# Patient Record
Sex: Female | Born: 1980 | Race: White | Hispanic: No | Marital: Married | Smoking: Never smoker
Health system: Southern US, Community
[De-identification: ages and names within clinical notes are randomized; demographics above are authoritative.]

## PROBLEM LIST (undated history)

## (undated) DIAGNOSIS — Z3400 Encounter for supervision of normal first pregnancy, unspecified trimester: Secondary | ICD-10-CM

## (undated) DIAGNOSIS — Z98891 History of uterine scar from previous surgery: Secondary | ICD-10-CM

## (undated) DIAGNOSIS — K219 Gastro-esophageal reflux disease without esophagitis: Secondary | ICD-10-CM

## (undated) DIAGNOSIS — O34219 Maternal care for unspecified type scar from previous cesarean delivery: Secondary | ICD-10-CM

## (undated) DIAGNOSIS — Z3483 Encounter for supervision of other normal pregnancy, third trimester: Secondary | ICD-10-CM

## (undated) DIAGNOSIS — G43909 Migraine, unspecified, not intractable, without status migrainosus: Secondary | ICD-10-CM

## (undated) DIAGNOSIS — F419 Anxiety disorder, unspecified: Secondary | ICD-10-CM

## (undated) HISTORY — PX: TONSILLECTOMY: SUR1361

---
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
## 2004-07-04 ENCOUNTER — Ambulatory Visit: Payer: Self-pay | Admitting: Family Medicine

## 2004-11-05 ENCOUNTER — Emergency Department (HOSPITAL_COMMUNITY): Admission: EM | Admit: 2004-11-05 | Discharge: 2004-11-05 | Payer: Self-pay | Admitting: Emergency Medicine

## 2005-04-11 ENCOUNTER — Ambulatory Visit: Payer: Self-pay | Admitting: Family Medicine

## 2005-05-01 ENCOUNTER — Ambulatory Visit: Payer: Self-pay | Admitting: Family Medicine

## 2005-11-24 ENCOUNTER — Ambulatory Visit: Payer: Self-pay | Admitting: Family Medicine

## 2005-12-28 ENCOUNTER — Other Ambulatory Visit: Admission: RE | Admit: 2005-12-28 | Discharge: 2005-12-28 | Payer: Self-pay | Admitting: Obstetrics and Gynecology

## 2006-05-29 ENCOUNTER — Ambulatory Visit: Payer: Self-pay | Admitting: Family Medicine

## 2006-06-06 ENCOUNTER — Ambulatory Visit: Payer: Self-pay | Admitting: Family Medicine

## 2006-08-07 ENCOUNTER — Ambulatory Visit: Payer: Self-pay | Admitting: Family Medicine

## 2006-08-14 ENCOUNTER — Encounter: Payer: Self-pay | Admitting: Family Medicine

## 2006-08-14 DIAGNOSIS — N6019 Diffuse cystic mastopathy of unspecified breast: Secondary | ICD-10-CM | POA: Insufficient documentation

## 2006-08-14 DIAGNOSIS — F411 Generalized anxiety disorder: Secondary | ICD-10-CM | POA: Insufficient documentation

## 2006-08-14 DIAGNOSIS — F430 Acute stress reaction: Secondary | ICD-10-CM | POA: Insufficient documentation

## 2006-08-14 DIAGNOSIS — L708 Other acne: Secondary | ICD-10-CM

## 2006-08-28 ENCOUNTER — Ambulatory Visit: Payer: Self-pay | Admitting: Family Medicine

## 2006-11-13 ENCOUNTER — Ambulatory Visit: Payer: Self-pay | Admitting: Family Medicine

## 2006-11-15 ENCOUNTER — Telehealth (INDEPENDENT_AMBULATORY_CARE_PROVIDER_SITE_OTHER): Payer: Self-pay | Admitting: *Deleted

## 2007-09-05 ENCOUNTER — Telehealth: Payer: Self-pay | Admitting: Family Medicine

## 2007-10-03 ENCOUNTER — Ambulatory Visit: Payer: Self-pay | Admitting: Family Medicine

## 2007-10-03 DIAGNOSIS — H6690 Otitis media, unspecified, unspecified ear: Secondary | ICD-10-CM | POA: Insufficient documentation

## 2010-04-01 LAB — HEPATITIS B SURFACE ANTIGEN
Hepatitis B Surface Ag: NEGATIVE
Hepatitis B Surface Ag: NEGATIVE

## 2010-04-01 LAB — HIV ANTIBODY (ROUTINE TESTING W REFLEX): HIV: NONREACTIVE

## 2010-04-01 LAB — ABO/RH: RH Type: POSITIVE

## 2010-04-01 LAB — TYPE AND SCREEN
Antibody Screen: NEGATIVE
Antibody Screen: NEGATIVE

## 2010-04-17 NOTE — L&D Delivery Note (Signed)
Requested by Dr. Ellyn Hack to attend this C-section for FTP.  Born to a 29y/o Primigravida with [facility] and negative screens.  AROM 10 hours PTD with clear fluid.  Labor complicated by FTp thus C-section performed.  Loose cord around body and neck noted at delivery.  Infant handed to Neo crying.  Fried, bulb suctioned and kept warm.  APGAR 8 and 9. Care transfer to Dr. Hosie Poisson.  Perlie Gold, MD Neonatologist
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
## 2010-10-23 ENCOUNTER — Inpatient Hospital Stay (HOSPITAL_COMMUNITY): Admission: AD | Admit: 2010-10-23 | Payer: Self-pay | Source: Home / Self Care | Admitting: Obstetrics and Gynecology

## 2010-10-25 ENCOUNTER — Encounter (HOSPITAL_COMMUNITY): Payer: Self-pay

## 2010-10-25 ENCOUNTER — Inpatient Hospital Stay (HOSPITAL_COMMUNITY)
Admission: RE | Admit: 2010-10-25 | Discharge: 2010-10-30 | DRG: 766 | Disposition: A | Discharge status: Home / Self Care | Payer: 59 | Source: Ambulatory Visit | Attending: Obstetrics and Gynecology | Admitting: Obstetrics and Gynecology

## 2010-10-25 DIAGNOSIS — O480 Post-term pregnancy: Principal | ICD-10-CM | POA: Diagnosis present

## 2010-10-25 DIAGNOSIS — F430 Acute stress reaction: Secondary | ICD-10-CM

## 2010-10-25 DIAGNOSIS — H6690 Otitis media, unspecified, unspecified ear: Secondary | ICD-10-CM

## 2010-10-25 DIAGNOSIS — Z3400 Encounter for supervision of normal first pregnancy, unspecified trimester: Secondary | ICD-10-CM

## 2010-10-25 DIAGNOSIS — N6019 Diffuse cystic mastopathy of unspecified breast: Secondary | ICD-10-CM

## 2010-10-25 DIAGNOSIS — F411 Generalized anxiety disorder: Secondary | ICD-10-CM

## 2010-10-25 DIAGNOSIS — L708 Other acne: Secondary | ICD-10-CM

## 2010-10-25 HISTORY — DX: Migraine, unspecified, not intractable, without status migrainosus: G43.909

## 2010-10-25 HISTORY — DX: Encounter for supervision of normal first pregnancy, unspecified trimester: Z34.00

## 2010-10-25 LAB — RPR: RPR Ser Ql: NONREACTIVE

## 2010-10-25 LAB — TYPE AND SCREEN: Antibody Screen: NEGATIVE

## 2010-10-25 LAB — CBC
Hemoglobin: 11.8 g/dL — ABNORMAL LOW (ref 12.0–15.0)
MCHC: 32.8 g/dL (ref 30.0–36.0)
RDW: 13.9 % (ref 11.5–15.5)
WBC: 10.8 10*3/uL — ABNORMAL HIGH (ref 4.0–10.5)

## 2010-10-25 MED ORDER — NALBUPHINE SYRINGE 5 MG/0.5 ML
10.0000 mg | INJECTION | INTRAMUSCULAR | Status: DC | PRN
  Filled 2010-10-25: qty 1

## 2010-10-25 MED ORDER — OXYTOCIN 20 UNITS IN LACTATED RINGERS INFUSION - SIMPLE
125.0000 mL/h | Freq: Once | INTRAVENOUS | Status: AC
  Administered 2010-10-25: 4 m[IU]/min via INTRAVENOUS
  Filled 2010-10-25: qty 1000

## 2010-10-25 MED ORDER — PHENYLEPHRINE 40 MCG/ML (10ML) SYRINGE FOR IV PUSH (FOR BLOOD PRESSURE SUPPORT)
80.0000 ug | PREFILLED_SYRINGE | INTRAVENOUS | Status: DC | PRN
  Filled 2010-10-25: qty 5

## 2010-10-25 MED ORDER — EPHEDRINE 5 MG/ML INJ
10.0000 mg | INTRAVENOUS | Status: DC | PRN
  Filled 2010-10-25: qty 4

## 2010-10-25 MED ORDER — LACTATED RINGERS IV SOLN
INTRAVENOUS | Status: DC
  Administered 2010-10-25 – 2010-10-26 (×2): via INTRAVENOUS
  Administered 2010-10-26: 500 mL via INTRAVENOUS

## 2010-10-25 MED ORDER — IBUPROFEN 600 MG PO TABS
600.0000 mg | ORAL_TABLET | Freq: Four times a day (QID) | ORAL | Status: DC | PRN
  Administered 2010-10-26: 600 mg via ORAL
  Filled 2010-10-25: qty 1

## 2010-10-25 MED ORDER — LIDOCAINE HCL (PF) 1 % IJ SOLN: 30.0000 mL | Freq: Once | INTRAMUSCULAR | Status: AC | PRN

## 2010-10-25 MED ORDER — TERBUTALINE SULFATE 1 MG/ML IJ SOLN: 0.2500 mg | Freq: Once | INTRAMUSCULAR | Status: AC | PRN

## 2010-10-25 MED ORDER — CITRIC ACID-SODIUM CITRATE 334-500 MG/5ML PO SOLN
30.0000 mL | ORAL | Status: DC | PRN
  Administered 2010-10-26: 30 mL via ORAL
  Filled 2010-10-25: qty 15

## 2010-10-25 MED ORDER — ONDANSETRON HCL 4 MG/2ML IJ SOLN: 4.0000 mg | Freq: Four times a day (QID) | INTRAMUSCULAR | Status: DC | PRN

## 2010-10-25 MED ORDER — EPHEDRINE 5 MG/ML INJ
10.0000 mg | INTRAVENOUS | Status: DC | PRN
  Filled 2010-10-25 (×2): qty 4

## 2010-10-25 MED ORDER — FLEET ENEMA 7-19 GM/118ML RE ENEM: 1.0000 | ENEMA | RECTAL | Status: DC | PRN

## 2010-10-25 MED ORDER — ACETAMINOPHEN 325 MG PO TABS: 650.0000 mg | ORAL_TABLET | ORAL | Status: DC | PRN

## 2010-10-25 MED ORDER — PHENYLEPHRINE 40 MCG/ML (10ML) SYRINGE FOR IV PUSH (FOR BLOOD PRESSURE SUPPORT)
80.0000 ug | PREFILLED_SYRINGE | INTRAVENOUS | Status: DC | PRN
  Filled 2010-10-25 (×2): qty 5

## 2010-10-25 MED ORDER — DIPHENHYDRAMINE HCL 50 MG/ML IJ SOLN
12.5000 mg | INTRAMUSCULAR | Status: DC | PRN
  Administered 2010-10-26: 12.5 mg via INTRAVENOUS
  Filled 2010-10-25: qty 1

## 2010-10-25 MED ORDER — FENTANYL 2.5 MCG/ML BUPIVACAINE 1/10 % EPIDURAL INFUSION (WH - ANES)
2.0000 mL/h | INTRAMUSCULAR | Status: DC
  Administered 2010-10-26: 14 mL/h via EPIDURAL
  Filled 2010-10-25: qty 60

## 2010-10-25 MED ORDER — LACTATED RINGERS IV SOLN: 500.0000 mL | Freq: Once | INTRAVENOUS | Status: DC

## 2010-10-25 MED ORDER — LACTATED RINGERS IV SOLN: 500.0000 mL | INTRAVENOUS | Status: DC | PRN

## 2010-10-25 MED ORDER — OXYTOCIN 20 UNITS IN LACTATED RINGERS INFUSION - SIMPLE
1.0000 m[IU]/min | INTRAVENOUS | Status: DC
  Administered 2010-10-25: 2 m[IU]/min via INTRAVENOUS
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
## 2010-10-25 NOTE — Progress Notes (Signed)
Margaret Campbell is a 30 y.o. G1P0000 at [redacted]w[redacted]d by LMP admitted for induction of labor due to Post dates. Due date 10/23/10.  Subjective: Contractions uncomfortable, o/w doing well.  Anxious to get going!  Objective: BP 114/70  Pulse 97  Resp 18  Ht 5\' 5"  (1.651 m)  Wt 99.791 kg (220 lb)  BMI 36.61 kg/m2  LMP 01/16/2010     FHT:  FHR: 120-140 bpm, variability: moderate,  accelerations:  Present,  decelerations:  Present variables after AROM UC:   regular, every 2-6 minutes SVE:   Dilation: 4 Effacement (%): 50 Station: -2 Exam by:: [facility] AROM - copious clear fluid.   Pitocin at 20mU/min  Labs: Lab Results  Component Value Date   WBC 10.8* 10/25/2010   HGB 11.8* 10/25/2010   HCT 36.0 10/25/2010   MCV 86.1 10/25/2010   PLT 207 10/25/2010    Assessment / Plan: Induction of labor due to postterm,  progressing well on pitocin AROM performed.    Labor: Progressing normally Preeclampsia:  no signs or symptoms of toxicity Fetal Wellbeing:  Category II Pain Control:  Epidural prn vs IV pain meds I/D:  n/a Anticipated MOD:  NSVD  BOVARD,Alonso Gapinski 10/25/2010, 8:12 PM

## 2010-10-25 NOTE — Progress Notes (Signed)
Pt with variables, FHTs 140s, mod variability; toco q 2-4 min. IUPC placed w/o diff/ comp. T/C amniinfusion if variables continue  JBOVARD MD

## 2010-10-26 ENCOUNTER — Inpatient Hospital Stay (HOSPITAL_COMMUNITY): Payer: 59 | Admitting: Anesthesiology

## 2010-10-26 ENCOUNTER — Encounter (HOSPITAL_COMMUNITY)
Admission: RE | Disposition: A | Discharge status: Home / Self Care | Payer: Self-pay | Source: Ambulatory Visit | Attending: Obstetrics and Gynecology

## 2010-10-26 ENCOUNTER — Encounter (HOSPITAL_COMMUNITY): Payer: Self-pay | Admitting: *Deleted

## 2010-10-26 ENCOUNTER — Encounter (HOSPITAL_COMMUNITY): Payer: Self-pay | Admitting: Anesthesiology

## 2010-10-26 DIAGNOSIS — Z3400 Encounter for supervision of normal first pregnancy, unspecified trimester: Secondary | ICD-10-CM

## 2010-10-26 HISTORY — DX: Encounter for supervision of normal first pregnancy, unspecified trimester: Z34.00

## 2010-10-26 SURGERY — Surgical Case
Anesthesia: Regional

## 2010-10-26 MED ORDER — OXYCODONE-ACETAMINOPHEN 5-325 MG PO TABS
1.0000 | ORAL_TABLET | ORAL | Status: DC | PRN
  Administered 2010-10-27 – 2010-10-30 (×11): 1 via ORAL
  Filled 2010-10-26 (×11): qty 1

## 2010-10-26 MED ORDER — LIDOCAINE-EPINEPHRINE (PF) 2 %-1:200000 IJ SOLN
INTRAMUSCULAR | Status: DC | PRN
  Administered 2010-10-26 (×2): 5 mL via EPIDURAL

## 2010-10-26 MED ORDER — KETOROLAC TROMETHAMINE 60 MG/2ML IM SOLN
60.0000 mg | Freq: Once | INTRAMUSCULAR | Status: AC | PRN
  Administered 2010-10-26: 60 mg via INTRAMUSCULAR

## 2010-10-26 MED ORDER — NALBUPHINE HCL 10 MG/ML IJ SOLN
5.0000 mg | INTRAMUSCULAR | Status: AC | PRN
  Filled 2010-10-26: qty 1

## 2010-10-26 MED ORDER — ONDANSETRON HCL 4 MG/2ML IJ SOLN
INTRAMUSCULAR | Status: DC | PRN
  Administered 2010-10-26: 4 mg via INTRAMUSCULAR

## 2010-10-26 MED ORDER — OXYTOCIN 20 UNITS IN LACTATED RINGERS INFUSION - SIMPLE: 125.0000 mL/h | INTRAVENOUS | Status: AC

## 2010-10-26 MED ORDER — PHENYLEPHRINE HCL 10 MG/ML IJ SOLN
INTRAMUSCULAR | Status: DC | PRN
  Administered 2010-10-26: 80 ug via INTRAVENOUS
  Administered 2010-10-26: 40 ug via INTRAVENOUS
  Administered 2010-10-26: 80 ug via INTRAVENOUS

## 2010-10-26 MED ORDER — PRENATAL PLUS 27-1 MG PO TABS
1.0000 | ORAL_TABLET | Freq: Every day | ORAL | Status: DC
  Administered 2010-10-26 – 2010-10-30 (×5): 1 via ORAL
  Filled 2010-10-26 (×4): qty 1

## 2010-10-26 MED ORDER — KETOROLAC TROMETHAMINE 30 MG/ML IJ SOLN: 30.0000 mg | Freq: Four times a day (QID) | INTRAMUSCULAR | Status: AC | PRN

## 2010-10-26 MED ORDER — MORPHINE SULFATE 0.5 MG/ML IJ SOLN
INTRAMUSCULAR | Status: AC
  Filled 2010-10-26: qty 10

## 2010-10-26 MED ORDER — DIPHENHYDRAMINE HCL 25 MG PO CAPS
25.0000 mg | ORAL_CAPSULE | Freq: Four times a day (QID) | ORAL | Status: DC | PRN
  Administered 2010-10-26: 25 mg via ORAL
  Filled 2010-10-26: qty 1

## 2010-10-26 MED ORDER — IBUPROFEN 600 MG PO TABS
600.0000 mg | ORAL_TABLET | Freq: Four times a day (QID) | ORAL | Status: DC
  Filled 2010-10-26: qty 1

## 2010-10-26 MED ORDER — FENTANYL CITRATE 0.05 MG/ML IJ SOLN: 25.0000 ug | INTRAMUSCULAR | Status: DC | PRN

## 2010-10-26 MED ORDER — MORPHINE SULFATE 10 MG/ML IJ SOLN
INTRAMUSCULAR | Status: DC | PRN
  Administered 2010-10-26: 1 mg via INTRAVENOUS

## 2010-10-26 MED ORDER — HYDROMORPHONE HCL 1 MG/ML IJ SOLN
0.5000 mg | INTRAMUSCULAR | Status: DC | PRN
  Administered 2010-10-26 (×2): 0.5 mg via INTRAVENOUS

## 2010-10-26 MED ORDER — NATALCARE PIC 60-1 MG PO TABS: 1.0000 | ORAL_TABLET | Freq: Every day | ORAL | Status: DC

## 2010-10-26 MED ORDER — OXYTOCIN 20 UNITS IN LACTATED RINGERS INFUSION - SIMPLE
INTRAVENOUS | Status: DC | PRN
  Administered 2010-10-26: 20 [IU] via INTRAVENOUS

## 2010-10-26 MED ORDER — ZOLPIDEM TARTRATE 5 MG PO TABS: 5.0000 mg | ORAL_TABLET | Freq: Every evening | ORAL | Status: DC | PRN

## 2010-10-26 MED ORDER — LIDOCAINE-EPINEPHRINE (PF) 2 %-1:200000 IJ SOLN
INTRAMUSCULAR | Status: AC
  Filled 2010-10-26: qty 20

## 2010-10-26 MED ORDER — SODIUM BICARBONATE 8.4 % IV SOLN
INTRAVENOUS | Status: AC
  Filled 2010-10-26: qty 50

## 2010-10-26 MED ORDER — MEPERIDINE HCL 25 MG/ML IJ SOLN: 6.2500 mg | INTRAMUSCULAR | Status: DC | PRN

## 2010-10-26 MED ORDER — SENNOSIDES-DOCUSATE SODIUM 8.6-50 MG PO TABS
1.0000 | ORAL_TABLET | Freq: Every day | ORAL | Status: DC
  Administered 2010-10-26 – 2010-10-29 (×4): 2 via ORAL

## 2010-10-26 MED ORDER — MIDAZOLAM HCL 2 MG/2ML IJ SOLN
INTRAMUSCULAR | Status: AC
  Filled 2010-10-26: qty 4

## 2010-10-26 MED ORDER — WITCH HAZEL-GLYCERIN EX PADS: MEDICATED_PAD | CUTANEOUS | Status: DC | PRN

## 2010-10-26 MED ORDER — EPHEDRINE SULFATE 50 MG/ML IJ SOLN
INTRAMUSCULAR | Status: DC | PRN
  Administered 2010-10-26: 10 mg via INTRAVENOUS
  Administered 2010-10-26: 20 mg via INTRAVENOUS

## 2010-10-26 MED ORDER — OXYTOCIN 20 UNITS IN LACTATED RINGERS INFUSION - SIMPLE
1.0000 m[IU]/min | INTRAVENOUS | Status: DC
  Administered 2010-10-26: 41.667 m[IU]/min via INTRAVENOUS
  Filled 2010-10-26: qty 1000

## 2010-10-26 MED ORDER — SODIUM CHLORIDE 0.9 % IV SOLN
1.0000 ug/kg/h | INTRAVENOUS | Status: DC | PRN
  Filled 2010-10-26: qty 2.5

## 2010-10-26 MED ORDER — IBUPROFEN 800 MG PO TABS
800.0000 mg | ORAL_TABLET | Freq: Three times a day (TID) | ORAL | Status: DC
  Administered 2010-10-27 – 2010-10-30 (×9): 800 mg via ORAL
  Filled 2010-10-26 (×9): qty 1

## 2010-10-26 MED ORDER — LACTATED RINGERS IV SOLN
INTRAVENOUS | Status: DC | PRN
  Administered 2010-10-26 (×2): via INTRAVENOUS

## 2010-10-26 MED ORDER — LIDOCAINE HCL (PF) 2 % IJ SOLN
INTRAMUSCULAR | Status: DC | PRN
  Administered 2010-10-26 (×3): 40 mg

## 2010-10-26 MED ORDER — EPHEDRINE 5 MG/ML INJ
INTRAVENOUS | Status: AC
  Filled 2010-10-26: qty 10

## 2010-10-26 MED ORDER — CEFAZOLIN SODIUM-DEXTROSE 2-3 GM-% IV SOLR
2.0000 g | Freq: Once | INTRAVENOUS | Status: DC
  Filled 2010-10-26: qty 50

## 2010-10-26 MED ORDER — SODIUM CHLORIDE 0.9 % IJ SOLN: 3.0000 mL | INTRAMUSCULAR | Status: DC | PRN

## 2010-10-26 MED ORDER — MENTHOL 3 MG MT LOZG: 1.0000 | LOZENGE | OROMUCOSAL | Status: DC | PRN

## 2010-10-26 MED ORDER — IBUPROFEN 600 MG PO TABS
600.0000 mg | ORAL_TABLET | Freq: Four times a day (QID) | ORAL | Status: DC | PRN
  Administered 2010-10-26 – 2010-10-27 (×3): 600 mg via ORAL
  Filled 2010-10-26 (×2): qty 1

## 2010-10-26 MED ORDER — PRENATAL PLUS 27-1 MG PO TABS
1.0000 | ORAL_TABLET | Freq: Every day | ORAL | Status: DC
  Filled 2010-10-26: qty 1

## 2010-10-26 MED ORDER — NALOXONE HCL 0.4 MG/ML IJ SOLN: 1.0000 ug/kg/h | INTRAMUSCULAR | Status: DC | PRN

## 2010-10-26 MED ORDER — OXYTOCIN 10 UNIT/ML IJ SOLN: 20.0000 [IU] | INTRAVENOUS | Status: DC | PRN

## 2010-10-26 MED ORDER — NALOXONE HCL 0.4 MG/ML IJ SOLN: 0.4000 mg | INTRAMUSCULAR | Status: DC | PRN

## 2010-10-26 MED ORDER — MORPHINE SULFATE (PF) 0.5 MG/ML IJ SOLN
INTRAMUSCULAR | Status: DC | PRN
  Administered 2010-10-26: 4 mg via EPIDURAL

## 2010-10-26 MED ORDER — ONDANSETRON HCL 4 MG/2ML IJ SOLN: 4.0000 mg | INTRAMUSCULAR | Status: DC | PRN

## 2010-10-26 MED ORDER — KETOROLAC TROMETHAMINE 60 MG/2ML IM SOLN
60.0000 mg | Freq: Once | INTRAMUSCULAR | Status: AC | PRN
  Filled 2010-10-26: qty 2

## 2010-10-26 MED ORDER — CEFAZOLIN SODIUM-DEXTROSE 2-3 GM-% IV SOLR: 2.0000 g | Freq: Three times a day (TID) | INTRAVENOUS | Status: DC

## 2010-10-26 MED ORDER — ONDANSETRON HCL 4 MG PO TABS: 4.0000 mg | ORAL_TABLET | ORAL | Status: DC | PRN

## 2010-10-26 MED ORDER — CEFAZOLIN SODIUM 1-5 GM-% IV SOLN
INTRAVENOUS | Status: DC | PRN
  Administered 2010-10-26: 2 g via INTRAVENOUS

## 2010-10-26 MED ORDER — SIMETHICONE 80 MG PO CHEW: 80.0000 mg | CHEWABLE_TABLET | ORAL | Status: DC | PRN

## 2010-10-26 MED ORDER — ONDANSETRON HCL 4 MG/2ML IJ SOLN
INTRAMUSCULAR | Status: AC
  Filled 2010-10-26: qty 2

## 2010-10-26 MED ORDER — OXYTOCIN 10 UNIT/ML IJ SOLN
INTRAMUSCULAR | Status: AC
  Filled 2010-10-26: qty 2

## 2010-10-26 MED ORDER — TETANUS-DIPHTH-ACELL PERTUSSIS 5-2.5-18.5 LF-MCG/0.5 IM SUSP
0.5000 mL | Freq: Once | INTRAMUSCULAR | Status: AC
  Administered 2010-10-29: 0.5 mL via INTRAMUSCULAR
  Filled 2010-10-26: qty 0.5

## 2010-10-26 MED ORDER — PHENYLEPHRINE 40 MCG/ML (10ML) SYRINGE FOR IV PUSH (FOR BLOOD PRESSURE SUPPORT)
PREFILLED_SYRINGE | INTRAVENOUS | Status: AC
  Filled 2010-10-26: qty 5

## 2010-10-26 MED ORDER — HYDROCORTISONE 0.5 % EX CREA
1.0000 "application " | TOPICAL_CREAM | Freq: Two times a day (BID) | CUTANEOUS | Status: DC
  Administered 2010-10-26 – 2010-10-29 (×4): 1 via TOPICAL
  Filled 2010-10-26: qty 28.35

## 2010-10-26 MED ORDER — SIMETHICONE 80 MG PO CHEW
80.0000 mg | CHEWABLE_TABLET | Freq: Three times a day (TID) | ORAL | Status: DC
  Administered 2010-10-26 – 2010-10-30 (×13): 80 mg via ORAL

## 2010-10-26 MED ORDER — ONDANSETRON HCL 4 MG/2ML IJ SOLN: 4.0000 mg | Freq: Once | INTRAMUSCULAR | Status: DC | PRN

## 2010-10-26 MED ORDER — ACETAMINOPHEN 325 MG PO TABS: 325.0000 mg | ORAL_TABLET | ORAL | Status: DC | PRN

## 2010-10-26 SURGICAL SUPPLY — 30 items
CHLORAPREP W/TINT 26ML (MISCELLANEOUS) ×2 IMPLANT
CLOTH BEACON ORANGE TIMEOUT ST (SAFETY) ×2 IMPLANT
CONTAINER PREFILL 10% NBF 15ML (MISCELLANEOUS) IMPLANT
DRAPE UTILITY XL STRL (DRAPES) ×2 IMPLANT
ELECT REM PT RETURN 9FT ADLT (ELECTROSURGICAL) ×2
ELECTRODE REM PT RTRN 9FT ADLT (ELECTROSURGICAL) ×1 IMPLANT
EXTRACTOR VACUUM M CUP 4 TUBE (SUCTIONS) IMPLANT
GLOVE BIO SURGEON STRL SZ 6.5 (GLOVE) ×2 IMPLANT
GLOVE BIO SURGEON STRL SZ7 (GLOVE) ×2 IMPLANT
GOWN BRE IMP SLV AUR LG STRL (GOWN DISPOSABLE) ×5 IMPLANT
KIT ABG SYR 3ML LUER SLIP (SYRINGE) IMPLANT
NDL HYPO 25X5/8 SAFETYGLIDE (NEEDLE) IMPLANT
NEEDLE HYPO 25X5/8 SAFETYGLIDE (NEEDLE) IMPLANT
NS IRRIG 1000ML POUR BTL (IV SOLUTION) ×2 IMPLANT
PACK C SECTION WH (CUSTOM PROCEDURE TRAY) ×2 IMPLANT
RTRCTR C-SECT PINK 25CM LRG (MISCELLANEOUS) IMPLANT
SLEEVE SCD COMPRESS KNEE MED (MISCELLANEOUS) IMPLANT
STAPLER VISISTAT 35W (STAPLE) ×1 IMPLANT
SUT MNCRL 0 VIOLET CTX 36 (SUTURE) ×2 IMPLANT
SUT MONOCRYL 0 CTX 36 (SUTURE) ×2
SUT PLAIN 1 NONE 54 (SUTURE) IMPLANT
SUT PLAIN 2 0 XLH (SUTURE) ×2 IMPLANT
SUT VIC AB 0 CT1 27 (SUTURE) ×4
SUT VIC AB 0 CT1 27XBRD ANBCTR (SUTURE) ×2 IMPLANT
SUT VIC AB 2-0 CT1 27 (SUTURE) ×2
SUT VIC AB 2-0 CT1 TAPERPNT 27 (SUTURE) ×1 IMPLANT
SYR BULB IRRIGATION 50ML (SYRINGE) ×1 IMPLANT
TOWEL OR 17X24 6PK STRL BLUE (TOWEL DISPOSABLE) ×4 IMPLANT
TRAY FOLEY CATH 14FR (SET/KITS/TRAYS/PACK) IMPLANT
WATER STERILE IRR 1000ML POUR (IV SOLUTION) ×2 IMPLANT

## 2010-10-26 NOTE — Anesthesia Procedure Notes (Addendum)
Epidural Patient location during procedure: OB Start time: 10/26/2010 1:27 AM  Preanesthetic Checklist Completed: patient identified, site marked, surgical consent, pre-op evaluation, timeout performed, IV checked, risks and benefits discussed and monitors and equipment checked  Epidural Patient position: sitting Prep: DuraPrep Patient monitoring: continuous pulse ox and blood pressure Approach: midline Injection technique: LOR air  Needle Needle type: Tuohy  Needle gauge: 17 G Needle length: 9 cm Catheter type: closed end flexible Catheter size: 19 Gauge Test dose: negative  Assessment Events: blood not aspirated, injection not painful, no injection resistance, negative IV test and no paresthesia  Additional Notes Discussed epidural, and patient consents to the procedure:  included risk of possible headache,backache, failed block, allergic reaction, and nerve injury. This patient was asked if she had any questions or concerns before the procedure started. See nursing notes for post-procedure vital signs.  Patient feels better.

## 2010-10-26 NOTE — Plan of Care (Signed)
Dr. Ellyn Hack updated on sve, fetal well being and UC pattern. Pt post epidural and comfortable. Informed of pt's UC's becoming tachysystole with repetitive variable deceleratrions. Order received to restart pitocin once baby has recovered at 1 mu/min and to increase by 1 mu/min. Will recheck cervix in two hours and update MD.

## 2010-10-26 NOTE — Plan of Care (Signed)
Dr. Ellyn Hack updated on sve, fetal well being, UC pattern, and pitocin dose. Requested to come and assess fhr tracing and to discuss plan of care with pt.

## 2010-10-26 NOTE — Anesthesia Preprocedure Evaluation (Addendum)
Anesthesia Evaluation  Name, MR# and DOB Patient awake  General Assessment Comment  Reviewed: Allergy & Precautions, H&P  and Patient's Chart, lab work & pertinent test results  Airway Mallampati: II TM Distance: >3 FB Neck ROM: full    Dental  (+) Teeth Intact   Pulmonary  clear to auscultation    Cardiovascular regular Normal   Neuro/Psych  GI/Hepatic/Renal   Endo/Other   (+)  Morbid obesity Abdominal   Musculoskeletal  Hematology   Peds  Reproductive/Obstetrics (+) Pregnancy   Anesthesia Other Findings Discussed epidural, and patient consents to the procedure:  included risk of possible headache,backache, failed block, allergic reaction, and nerve injury. This patient was asked if she had any questions or concerns before the procedure started.                Anesthesia Physical Anesthesia Plan  ASA: III  Anesthesia Plan: Epidural   Post-op Pain Management:    Induction:   Airway Management Planned:   Additional Equipment:   Intra-op Plan:   Post-operative Plan:   Informed Consent:   Plan Discussed with:   Anesthesia Plan Comments:        Anesthesia Quick Evaluation

## 2010-10-26 NOTE — Op Note (Signed)
Margaret Campbell, Margaret Campbell                ACCOUNT NO.:  [id number]  MEDICAL RECORD NO.:  [id number]  LOCATION:  [facility]                          FACILITY:  [facility]  PHYSICIAN:  Sherron Monday, MD        DATE OF BIRTH:  1980/04/18  DATE OF PROCEDURE:  10/26/2010 DATE OF DISCHARGE:                              OPERATIVE REPORT   PREOPERATIVE DIAGNOSES:  Intrauterine pregnancy at 40 plus weeks, arrest of dilatation, failed induction of labor.  POSTOPERATIVE DIAGNOSES:  Intrauterine pregnancy at 40 plus weeks, arrest of dilatation, failed induction of labor, delivered.  PROCEDURE:  Primary low transverse cesarean section.  SURGEON:  Sherron Monday, MD  ANESTHESIA:  Epidural.  ESTIMATED BLOOD LOSS:  700 mL  IV FLUIDS:  1300 mL.  URINE OUTPUT:  100 mL.  Clear urine at the end of procedure.  FINDINGS:  Viable female infant at 6:20 a.m. with Apgar's of 8 at 1 minute, 9 at 5 minutes, 7 pounds 7 ounces, normal uterus, tubes and ovaries are noted.  PATHOLOGY:  Placenta to L&D  COMPLICATIONS:  None.  PROCEDURE:  After informed consent was reviewed with the patient including risks, benefits, and alternatives of the surgical procedure, she was transported to the OR where epidural was dosed.  She was placed inside position with a leftward tilt after carefully making sure her epidural was adequate.  She was prepped and draped in the normal sterile fashion.  Pfannenstiel incision was made at the level approximately fingerbreadths above the pubic symphysis, carried through the underlying layer of fascia sharply.  The fascia was incised in the midline and incision was extended laterally with Mayo scissors.  The inferior aspect of fascial incision was grasped with Kocher clamps, elevating the rectus muscles were dissected off both bluntly and sharply.  Attention was then turned to the superior portion which in a similar fashion was grasped with Kocher clamps, elevating the rectus muscles were dissected off  both bluntly and sharply.  Midline was easily identified and the peritoneum was entered bluntly.  Incision was extended superiorly and inferiorly with good visualization of the bladder.  The Alexis skin retractor was carefully placed to make sure no bowel was entrapped.  The uterus was inspected.  The vesicouterine peritoneum was easily identified, tented up with the pickups, and then bladder flap was created with Potts scissors and the uterus was incised in transverse fashion.  Infant was delivered from the vertex presentation.  Nose and mouth were suctioned on the field.  Cord was clamped and cut.  Infant was handed off to the awaiting pediatric staff.  The cord blood was collected from the placenta.  The placenta was then expressed from the uterus.  Uterus was cleared of all clot and debris.  Uterine incision was closed with 2 layers of 0 Monocryl, first of which is running and second is an imbricating.  Hemostasis was assured with Bovie cautery and a single suture of 0 Vicryl as a figure-of-eight at the corner.  Copious pelvic irrigation was performed.  The Alexis retractor was removed.  The peritoneum was reapproximated with 2-0 Vicryl in a running fashion. Subfascial planes were inspected and found to be hemostatic.  Fascia was closed with 0 Vicryl in a running fashion.  Subcuticular adipose layer was made hemostatic with Bovie cautery.  The dead space was closed using 3-0 plain gut and was closed with staples.  The patient tolerated the procedure well.  Sponge, lap, and needle counts were correct x2.  The patient was transported in stable condition to the PACU following the procedure.     Sherron Monday, MD     JB/MEDQ  D:  10/26/2010  T:  10/26/2010  Job:  [id number]

## 2010-10-26 NOTE — Anesthesia Postprocedure Evaluation (Signed)
  Anesthesia Post-op Note  Patient: Margaret Campbell  Procedure(s) Performed:  CESAREAN SECTION  Patient is awake and responsive. Pain and nausea are reasonably well controlled. Vital signs are stable and clinically acceptable. Oxygen saturation is clinically acceptable. There are no apparent anesthetic complications at this time. Patient is ready for discharge.

## 2010-10-26 NOTE — Transfer of Care (Signed)
Immediate Anesthesia Transfer of Care Note  Patient: Margaret Campbell  Procedure(s) Performed:  CESAREAN SECTION  Patient Location: PACU  Anesthesia Type: Regional  Level of Consciousness: awake  Airway & Oxygen Therapy: Patient Spontanous Breathing  Post-op Assessment: Report given to PACU RN and Post -op Vital signs reviewed and stable  Post vital signs: Reviewed and stable  Complications: No apparent anesthesia complications

## 2010-10-26 NOTE — Progress Notes (Signed)
Margaret Campbell is a 30 y.o. G1P0000 at [redacted]w[redacted]d by LMP admitted for induction of labor due to Post dates. Due date 10/23/10.  Subjective:   Objective: BP 107/54  Pulse 84  Temp(Src) 97.9 F (36.6 C) (Oral)  Resp 20  Ht 5\' 5"  (1.651 m)  Wt 99.791 kg (220 lb)  BMI 36.61 kg/m2  SpO2 100%  LMP 01/16/2010      FHT:  FHR: 130's bpm, variability: moderate,  accelerations:  Abscent,  decelerations:  Present pt with variables, resolved by amnioinfusion.   UC:   regular, every 2-4 minutes SVE:   Dilation: 5 Effacement (%): 80 Station: -2 Exam by:: [facility]: Lab Results  Component Value Date   WBC 10.8* 10/25/2010   HGB 11.8* 10/25/2010   HCT 36.0 10/25/2010   MCV 86.1 10/25/2010   PLT 207 10/25/2010    Assessment / Plan: Induction of labor due to postterm,  With failure to progress, arrest of dilitation.  D/W pt r/b/a of LTCS including but not limited to bleeding, infwection, damage to surrounding organs, trouble healing.  Questions answered.  Exam repeated, unchanged from previous exam.    Labor: FTP Preeclampsia:  no signs or symptoms of toxicity Fetal Wellbeing:  Category II Pain Control:  Epidural I/D:  n/a Anticipated MOD:  LTCS  BOVARD,Maybree Riling 10/26/2010, 5:20 AM

## 2010-10-26 NOTE — Progress Notes (Signed)
[facility] AND NICU INFORMATION GIVEN TO PATIENT.  BABY NURSED VERY WELL IN PACU PER PATIENT BUT WAS TRANSFERRED TO NICU FOR LOW BLOOD SUGARS.  PATIENT HAS PUMPED TWICE OBTAINING A FEW CC'S OF COLOSTRUM.  REVIEWED PUMPING FREQUENCY AND ENCOURAGED TO CALL FOR CONCERNS OR ASSIST PRN.

## 2010-10-26 NOTE — Brief Op Note (Signed)
10/26/2010  7:13 AM  PATIENT:  Margaret Campbell  30 y.o. female  PRE-OPERATIVE DIAGNOSIS:  Failure To Progress  POST-OPERATIVE DIAGNOSIS:  Same; Primary C-Section Female At 0620; Apgar 8/9, 7 lb 7 oz  PROCEDURE:  Procedure(s): CESAREAN SECTION  SURGEON:  Surgeon(s): Alexsandro Salek Bovard   ANESTHESIA:   epidural  ESTIMATED BLOOD LOSS: 700cc    SPECIMEN:  Source of Specimen:  Placenta  DISPOSITION OF SPECIMEN:  L&D  COUNTS:  YES  DICTATION #: [id number]  PLAN OF CARE: postpartum care  PATIENT DISPOSITION:  PACU - hemodynamically stable.   Delay start of Pharmacological VTE agent (>24hrs) due to surgical blood loss or risk of bleeding:  No  JBOVARD

## 2010-10-26 NOTE — Anesthesia Postprocedure Evaluation (Signed)
  Anesthesia Post-op Note  Patient: Margaret Campbell  Procedure(s) Performed:  CESAREAN SECTION  Patient Location: PACU  Anesthesia Type: Regional  Level of Consciousness: awake  Airway and Oxygen Therapy: Patient Spontanous Breathing  Post-op Pain: mild  Post-op Assessment: Post-op Vital signs reviewed and Patient's Cardiovascular Status Stable  Post-op Vital Signs: Reviewed and stable  Complications: No apparent anesthesia complications

## 2010-10-26 NOTE — Transfer of Care (Signed)
Immediate Anesthesia Transfer of Care Note  Patient: Margaret Campbell  Procedure(s) Performed:  CESAREAN SECTION  Patient Location: PACU  Anesthesia Type: Epidural  Level of Consciousness: awake, alert , oriented and patient cooperative  Airway & Oxygen Therapy: Patient Spontanous Breathing  Post-op Assessment: Report given to PACU RN and Post -op Vital signs reviewed and stable  Post vital signs: Reviewed and stable  Complications: No apparent anesthesia complications

## 2010-10-27 LAB — CBC
HCT: 28.4 % — ABNORMAL LOW (ref 36.0–46.0)
Platelets: 174 10*3/uL (ref 150–400)
RDW: 13.9 % (ref 11.5–15.5)
WBC: 11.2 10*3/uL — ABNORMAL HIGH (ref 4.0–10.5)

## 2010-10-27 NOTE — Progress Notes (Signed)
Baby was transferred to the NICU due to low blood sugar and inc respirations. Mom is pumping q 3 hours and obtaining 10 to 20cc each time. Baby is being bottle fed EBM and formula. Mom is [employer] employee and plans to purchase pump. No questions at present

## 2010-10-27 NOTE — Progress Notes (Signed)
Subjective: Postpartum Day 1: Cesarean Delivery Patient reports incisional pain.  Nl lochia, pain controlled  Objective: Vital signs in last 24 hours: Temp:  [97.2 F (36.2 C)-98.6 F (37 C)] 97.2 F (36.2 C) (07/12 1191) Pulse Rate:  [79-102] 81  (07/12 0632) Resp:  [16-24] 16  (07/12 0632) BP: (91-119)/(55-79) 99/62 mmHg (07/12 0632) SpO2:  [94 %-100 %] 98 % (07/12 4782)  Physical Exam:  General: alert and no distress Lochia: appropriate Uterine Fundus: firm Incision: bandage intact   Basename 10/27/10 0525 10/25/10 1753  HGB 9.3* 11.8*  HCT 28.4* 36.0    Assessment/Plan: Status post Cesarean section. Doing well postoperatively.  Continue current care.  BOVARD,Euriah Matlack 10/27/2010, 8:27 AM

## 2010-10-28 NOTE — Progress Notes (Signed)
Subjective: Postpartum Day 2: Cesarean Delivery Patient reports incisional pain. No nausea, tol po, ambulating and voiding w/o problems   Objective: Vital signs in last 24 hours: Temp:  [97.8 F (36.6 C)-98.3 F (36.8 C)] 97.8 F (36.6 C) (07/13 0600) Pulse Rate:  [96-108] 96  (07/13 0600) Resp:  [18-20] 20  (07/13 0600) BP: (116-121)/(78-82) 121/82 mmHg (07/13 0600)  Physical Exam:  General: alert and no distress Lochia: appropriate Uterine Fundus: firm Incision: healing well    Basename 10/27/10 0525 10/25/10 1753  HGB 9.3* 11.8*  HCT 28.4* 36.0    Assessment/Plan: Status post Cesarean section. Doing well postoperatively.  Continue current care.  BOVARD,Elana Jian 10/28/2010, 8:49 AM

## 2010-10-28 NOTE — Progress Notes (Signed)
Assisted  Mom with latching infant. Infant initially sleepy, but latched well with assistance, mom able to self latch, good suckles and swallows noted, mom encouraged with today's success. Encouraged mom to rent a hospital grade pump for first month, taught hand expression, will   follow prn

## 2010-10-29 NOTE — Progress Notes (Signed)
#  3 afebrile Pt is doing well but wants to stay another day because the baby is in the NICU.

## 2010-10-30 MED ORDER — IBUPROFEN 600 MG PO TABS: 600.0000 mg | ORAL_TABLET | Freq: Four times a day (QID) | ORAL | Status: AC | PRN

## 2010-10-30 MED ORDER — IRON 325 (65 FE) MG PO TABS: 1.0000 | ORAL_TABLET | Freq: Two times a day (BID) | ORAL | Status: AC

## 2010-10-30 MED ORDER — OXYCODONE-ACETAMINOPHEN 5-325 MG PO TABS: 1.0000 | ORAL_TABLET | Freq: Four times a day (QID) | ORAL | Status: AC | PRN

## 2010-10-30 NOTE — Discharge Summary (Signed)
Margaret Campbell, Margaret Campbell                ACCOUNT NO.:  [id number]  MEDICAL RECORD NO.:  [id number]  LOCATION:  9113                          FACILITY:  [facility]  PHYSICIAN:  Malachi Pro. Ambrose Mantle, M.D. DATE OF BIRTH:  Jun 22, 1980  DATE OF ADMISSION:  10/25/2010 DATE OF DISCHARGE:                              DISCHARGE SUMMARY   This is a 30 year old white female para 0, gravida 1 at 8+ weeks' gestation who is admitted for induction of labor.  Ultimately, the patient required a C-section and underwent a low-transverse cervical C- section by Dr. Ellyn Hack with delivery of a living female infant, Apgars were 9 and nine at 1 and five minutes.  The baby was transferred to the NICU at some point and has remained there and is still in the NICU initially for blood sugar problems and now for an unexplained fever.  The patient has done well postoperatively.  She has tolerated diet, ambulated well without difficulty, passed flatus, voided well and is ready for discharge.  On the fourth postop day, she was kept until the fourth postop day because on the third postop day, she wanted an extra day because of the baby's presence in the NICU.  Her initial hemoglobin was 11.8, hematocrit 36, white count 10,800 and platelet count 207,000. Followup hemoglobin was 9.3.  RPR was nonreactive.  FINAL DIAGNOSIS:  Intrauterine pregnancy at 40 plus weeks with relative cephalopelvic disproportion.  OPERATION:  Low-transverse cervical cesarean section.  FINAL CONDITION:  Improved.  INSTRUCTIONS:  Regular discharge instruction booklet.  Motrin 600 mg 30 tablets one every 6 h. as needed for pain and Percocet 5/325 20 tablets 1 every 4-6 h. as needed for pain given at discharge.  The patient is advised to return to the office in 3 days for followup examination and staple removal.     Malachi Pro. Ambrose Mantle, M.D.     TFH/MEDQ  D:  10/30/2010  T:  10/30/2010  Job:  [id number]

## 2010-10-30 NOTE — Progress Notes (Signed)
Per mom milk is in bilaterally , greater yield on right breast compared to left . (Per mom  Feels engorged inner aspect of both breast ) also IBCLC noted with assessment on left breast a back up ridge in upper portion of breast . Encouraged ice 15-20 min prior to if needed and hand massage prior to pumping ,and intermittent with pumping . Observed pumping 27 flange looks like a good fit. Mom reports comfort while pumping . This afternoon Mom plans to call IBCLC to purchase pump in Store. Mom and Dad have concerns regarding feedings in NICU ,encouraged parents to speak with the practioner.

## 2010-10-30 NOTE — Plan of Care (Signed)
Problem: Discharge Progression Outcomes Goal: Remove staples per MD order Outcome: Progressing To be done outpt

## 2010-10-30 NOTE — Progress Notes (Signed)
  Afebrile doing well Baby has developed a fever so is being checked for viral infection.

## 2010-11-02 NOTE — Progress Notes (Signed)
Asissted with latching this full term infant , prior to discharge to home. Infant nipples well from bottle, but has dfificulty maintaining latch at breast.  Tried gloved finger to assess suck, and found infant's tongue protrudes only slightly past gum liine - possible tongue tie.  Attempted latch with nipple shield, but infant only transferred 4 mls.  Suggested  to  Mom that she continue to try with shield, pump every 3 hours, and continue to bottle feed expressed milk, and to call Lactation and make an appointment for an outpatient consult. Mom was fine with this plan, and happy as long as infant was receiving her breast milk

## 2010-11-15 ENCOUNTER — Encounter (HOSPITAL_COMMUNITY): Payer: Self-pay | Admitting: Obstetrics and Gynecology

## 2011-07-16 ENCOUNTER — Encounter (HOSPITAL_COMMUNITY): Payer: Self-pay | Admitting: *Deleted

## 2011-07-16 ENCOUNTER — Emergency Department (HOSPITAL_COMMUNITY)
Admission: EM | Admit: 2011-07-16 | Discharge: 2011-07-16 | Disposition: A | Discharge status: Home / Self Care | Payer: 59 | Source: Home / Self Care

## 2011-07-16 DIAGNOSIS — J029 Acute pharyngitis, unspecified: Secondary | ICD-10-CM

## 2011-07-16 DIAGNOSIS — R05 Cough: Secondary | ICD-10-CM

## 2011-07-16 DIAGNOSIS — J020 Streptococcal pharyngitis: Secondary | ICD-10-CM

## 2011-07-16 LAB — POCT RAPID STREP A: Streptococcus, Group A Screen (Direct): POSITIVE — AB

## 2011-07-16 MED ORDER — AMOXICILLIN 500 MG PO CAPS: 500.0000 mg | ORAL_CAPSULE | Freq: Three times a day (TID) | ORAL | Status: AC

## 2011-07-16 NOTE — Discharge Instructions (Signed)
Thank you for coming in today. I think you have strep throat.  Take antibiotics 3 times a day for one week.  Followup with your doctor if you do not get better.  Call or go to the emergency room if you get worse, have trouble breathing, have chest pains, or palpitations.   Strep Throat Strep throat is an infection of the throat caused by a bacteria named Streptococcus pyogenes. Your caregiver may call the infection streptococcal "tonsillitis" or "pharyngitis" depending on whether there are signs of inflammation in the tonsils or back of the throat. Strep throat is most common in children from 2 to 43 years old during the cold months of the year, but it can occur in people of any age during any season. This infection is spread from person to person (contagious) through coughing, sneezing, or other close contact. SYMPTOMS   Fever or chills.   Painful, swollen, red tonsils or throat.   Pain or difficulty when swallowing.   White or yellow spots on the tonsils or throat.   Swollen, tender lymph nodes or "glands" of the neck or under the jaw.   Red rash all over the body (rare).  DIAGNOSIS  Many different infections can cause the same symptoms. A test must be done to confirm the diagnosis so the right treatment can be given. A "rapid strep test" can help your caregiver make the diagnosis in a few minutes. If this test is not available, a light swab of the infected area can be used for a throat culture test. If a throat culture test is done, results are usually available in a day or two. TREATMENT  Strep throat is treated with antibiotic medicine. HOME CARE INSTRUCTIONS   Gargle with 1 tsp of salt in 1 cup of warm water, 3 to 4 times per day or as needed for comfort.   Family members who also have a sore throat or fever should be tested for strep throat and treated with antibiotics if they have the strep infection.   Make sure everyone in your household washes their hands well.   Do not  share food, drinking cups, or personal items that could cause the infection to spread to others.   You may need to eat a soft food diet until your sore throat gets better.   Drink enough water and fluids to keep your urine clear or pale yellow. This will help prevent dehydration.   Get plenty of rest.   Stay home from school, daycare, or work until you have been on antibiotics for 24 hours.   Only take over-the-counter or prescription medicines for pain, discomfort, or fever as directed by your caregiver.   If antibiotics are prescribed, take them as directed. Finish them even if you start to feel better.  SEEK MEDICAL CARE IF:   The glands in your neck continue to enlarge.   You develop a rash, cough, or earache.   You cough up green, yellow-brown, or bloody sputum.   You have pain or discomfort not controlled by medicines.   Your problems seem to be getting worse rather than better.  SEEK IMMEDIATE MEDICAL CARE IF:   You develop any new symptoms such as vomiting, severe headache, stiff or painful neck, chest pain, shortness of breath, or trouble swallowing.   You develop severe throat pain, drooling, or changes in your voice.   You develop swelling of the neck, or the skin on the neck becomes red and tender.   You have a  fever.   You develop signs of dehydration, such as fatigue, dry mouth, and decreased urination.   You become increasingly sleepy, or you cannot wake up completely.  Document Released: 03/31/2000 Document Revised: 03/23/2011 Document Reviewed: 06/02/2010 [facility] Patient Information 2012 [place], [place].

## 2011-07-16 NOTE — ED Notes (Signed)
Pt with onset of sorethroat/fever Friday onset of cough this am

## 2011-07-16 NOTE — ED Provider Notes (Signed)
Margaret Campbell is a 31 y.o. female who presents to Urgent Care today for sore throat since Friday associated with a mild fever of 100.3 and a cough. No chest pains trouble breathing nasal congestion with abdominal pains vomiting or diarrhea. She feels well otherwise.   PMH reviewed. Significant for tonsillectomy as an adolescent do to multiple strep throat infections ROS as above otherwise neg Medications reviewed. No current facility-administered medications for this encounter.   Current Outpatient Prescriptions  Medication Sig Dispense Refill  . ibuprofen (ADVIL,MOTRIN) 800 MG tablet Take 800 mg by mouth every 8 (eight) hours as needed.      . Prenatal Vit-Fe Psac Cmplx-FA (PRENATAL MULTIVITAMIN) 60-1 MG tablet Take 1 tablet by mouth daily with breakfast.        . amoxicillin (AMOXIL) 500 MG capsule Take 1 capsule (500 mg total) by mouth 3 (three) times daily.  21 capsule  0  . Ferrous Sulfate (IRON) 325 (65 FE) MG TABS Take 1 tablet by mouth 2 (two) times daily.  30 each  0  . hydrocortisone 0.5 % cream Apply 1 application topically 2 (two) times daily.          Exam:  BP 120/71  Pulse 91  Temp(Src) 98 F (36.7 C) (Oral)  Resp 17  SpO2 100%  LMP 07/06/2011  Breastfeeding? Yes Gen: Well NAD, nontoxic appearing HEENT: EOMI,  MMM, normal tympanic membranes bilaterally. Posterior pharynx is erythematous with exudate on the left side. Mild bilateral anterior cervical lymphadenopathy Lungs: CTABL Nl WOB Heart: RRR no MRG Abd: NABS, NT, ND Exts: Non edematous BL  LE, warm and well perfused.   Results for orders placed during the hospital encounter of 07/16/11 (from the past 24 hour(s))  POCT RAPID STREP A ([facility]RG CARE ONLY)     Status: Abnormal   Collection Time   07/16/11  9:41 AM      Component Value Range   Streptococcus, Group A Screen (Direct) POSITIVE (*) NEGATIVE      Assessment and Plan: 31 year old woman with strep throat.  Plan to treat with amoxicillin 3 times a day  for one week and follow up with primary care provider if not getting better.  Discussed warning signs or symptoms with patient who expresses understanding.  Please  see patient discharge instructions    Rodolph Bong, MD 07/16/11 1012

## 2011-07-16 NOTE — ED Provider Notes (Signed)
Medical screening examination/treatment/procedure(s) were performed by PGY-3 FM resident and as supervising physician I was immediately available for consultation/collaboration.   Kenzly Rogoff Moreno-Coll, MD   Derrel Moore Moreno-Coll, MD 07/16/11 1941 

## 2012-04-02 ENCOUNTER — Emergency Department (HOSPITAL_BASED_OUTPATIENT_CLINIC_OR_DEPARTMENT_OTHER): Payer: 59

## 2012-04-02 ENCOUNTER — Encounter (HOSPITAL_BASED_OUTPATIENT_CLINIC_OR_DEPARTMENT_OTHER): Payer: Self-pay

## 2012-04-02 ENCOUNTER — Emergency Department (HOSPITAL_BASED_OUTPATIENT_CLINIC_OR_DEPARTMENT_OTHER)
Admission: EM | Admit: 2012-04-02 | Discharge: 2012-04-02 | Disposition: A | Discharge status: Home / Self Care | Payer: 59 | Attending: Emergency Medicine | Admitting: Emergency Medicine

## 2012-04-02 DIAGNOSIS — R071 Chest pain on breathing: Secondary | ICD-10-CM | POA: Insufficient documentation

## 2012-04-02 DIAGNOSIS — M62838 Other muscle spasm: Secondary | ICD-10-CM | POA: Insufficient documentation

## 2012-04-02 DIAGNOSIS — R059 Cough, unspecified: Secondary | ICD-10-CM | POA: Insufficient documentation

## 2012-04-02 DIAGNOSIS — R05 Cough: Secondary | ICD-10-CM | POA: Insufficient documentation

## 2012-04-02 DIAGNOSIS — R0602 Shortness of breath: Secondary | ICD-10-CM | POA: Insufficient documentation

## 2012-04-02 DIAGNOSIS — R0789 Other chest pain: Secondary | ICD-10-CM

## 2012-04-02 MED ORDER — CYCLOBENZAPRINE HCL 10 MG PO TABS: 10.0000 mg | ORAL_TABLET | Freq: Two times a day (BID) | ORAL | Status: DC | PRN
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
## 2012-04-02 NOTE — ED Notes (Signed)
C/o right side CP that radiates to right arm and jaw-intermittent x 4 days-became sob with pain tonight

## 2012-12-06 ENCOUNTER — Emergency Department (INDEPENDENT_AMBULATORY_CARE_PROVIDER_SITE_OTHER): Payer: 59

## 2012-12-06 ENCOUNTER — Emergency Department (HOSPITAL_COMMUNITY)
Admission: EM | Admit: 2012-12-06 | Discharge: 2012-12-06 | Disposition: A | Discharge status: Home / Self Care | Payer: 59 | Source: Home / Self Care

## 2012-12-06 ENCOUNTER — Encounter (HOSPITAL_COMMUNITY): Payer: Self-pay

## 2012-12-06 DIAGNOSIS — R05 Cough: Secondary | ICD-10-CM

## 2012-12-06 DIAGNOSIS — R0982 Postnasal drip: Secondary | ICD-10-CM

## 2012-12-06 DIAGNOSIS — H6692 Otitis media, unspecified, left ear: Secondary | ICD-10-CM

## 2012-12-06 DIAGNOSIS — H6690 Otitis media, unspecified, unspecified ear: Secondary | ICD-10-CM

## 2012-12-06 DIAGNOSIS — J069 Acute upper respiratory infection, unspecified: Secondary | ICD-10-CM

## 2012-12-06 DIAGNOSIS — J9801 Acute bronchospasm: Secondary | ICD-10-CM

## 2012-12-06 MED ORDER — CEFUROXIME AXETIL 250 MG PO TABS: 250.0000 mg | ORAL_TABLET | Freq: Two times a day (BID) | ORAL | Status: DC

## 2012-12-06 MED ORDER — ALBUTEROL SULFATE HFA 108 (90 BASE) MCG/ACT IN AERS: 2.0000 | INHALATION_SPRAY | Freq: Four times a day (QID) | RESPIRATORY_TRACT | Status: AC | PRN

## 2012-12-06 MED ORDER — PREDNISONE 20 MG PO TABS: ORAL_TABLET | ORAL | Status: DC
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
## 2012-12-06 NOTE — ED Provider Notes (Signed)
Medical screening examination/treatment/procedure(s) were performed by non-physician practitioner and as supervising physician I was immediately available for consultation/collaboration.   [facility]; MD  Sharin Grave, MD 12/06/12 [phone]

## 2012-12-06 NOTE — ED Notes (Signed)
Cough, yellow secretions since 8-13, now bothering her both ears and into jaw; minimal relief w OTC medications

## 2013-01-30 ENCOUNTER — Encounter (HOSPITAL_COMMUNITY): Payer: Self-pay | Admitting: Emergency Medicine

## 2013-01-30 ENCOUNTER — Emergency Department (HOSPITAL_COMMUNITY)
Admission: EM | Admit: 2013-01-30 | Discharge: 2013-01-30 | Disposition: A | Discharge status: Home / Self Care | Payer: 59 | Source: Home / Self Care | Attending: Emergency Medicine | Admitting: Emergency Medicine

## 2013-01-30 ENCOUNTER — Emergency Department (INDEPENDENT_AMBULATORY_CARE_PROVIDER_SITE_OTHER): Payer: 59

## 2013-01-30 DIAGNOSIS — J069 Acute upper respiratory infection, unspecified: Secondary | ICD-10-CM

## 2013-01-30 DIAGNOSIS — J029 Acute pharyngitis, unspecified: Secondary | ICD-10-CM

## 2013-01-30 MED ORDER — METHYLPREDNISOLONE 4 MG PO KIT: PACK | ORAL | Status: DC

## 2013-01-30 MED ORDER — PSEUDOEPH-BROMPHEN-DM 30-2-10 MG/5ML PO SYRP: 5.0000 mL | ORAL_SOLUTION | Freq: Four times a day (QID) | ORAL | Status: DC | PRN

## 2013-01-30 NOTE — ED Provider Notes (Signed)
Medical screening examination/treatment/procedure(s) were performed by non-physician practitioner and as supervising physician I was immediately available for consultation/collaboration.  Sukanya Goldblatt, M.D.  Brendalee Matthies C Mozelle Remlinger, MD 01/30/13 1712 

## 2013-01-30 NOTE — ED Notes (Signed)
Pt c/o cold sxs onset 1 week Sxs include: ST, bilateral ear pain, cough w/yellow phlegm Niece is being treated w/strep Denies: f/v/n/d, SOB, wheezing She is alert w/no signs of acute distress.
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
## 2013-02-01 LAB — CULTURE, GROUP A STREP

## 2013-12-16 LAB — OB RESULTS CONSOLE HIV ANTIBODY (ROUTINE TESTING): HIV: NONREACTIVE

## 2013-12-16 LAB — OB RESULTS CONSOLE RPR: RPR: NONREACTIVE

## 2013-12-16 LAB — OB RESULTS CONSOLE RUBELLA ANTIBODY, IGM: Rubella: IMMUNE

## 2013-12-16 LAB — OB RESULTS CONSOLE HEPATITIS B SURFACE ANTIGEN: HEP B S AG: NEGATIVE

## 2013-12-16 LAB — OB RESULTS CONSOLE ANTIBODY SCREEN: Antibody Screen: NEGATIVE

## 2013-12-16 LAB — OB RESULTS CONSOLE ABO/RH: RH TYPE: POSITIVE

## 2013-12-16 LAB — OB RESULTS CONSOLE GC/CHLAMYDIA
Chlamydia: NEGATIVE
GC PROBE AMP, GENITAL: NEGATIVE

## 2014-02-16 ENCOUNTER — Encounter (HOSPITAL_COMMUNITY): Payer: Self-pay | Admitting: Emergency Medicine

## 2014-04-17 HISTORY — DX: Maternal care for unspecified type scar from previous cesarean delivery: O34.219

## 2014-06-22 NOTE — Patient Instructions (Addendum)
   Your procedure is scheduled on:  Friday, March 11  [facility] through [facility] at:  6 AM Pick up the phone at the desk and dial [phone] and inform us of your arrival.  Please call this number if you have any problems the morning of surgery: [phone]  Remember: Do not eat or drink after midnight: Thursday Take these medicines the morning of surgery with a SIP OF WATER:  Omeprazole.   Do not wear jewelry, make-up, or FINGER nail polish No metal in your hair or on your body. Do not wear lotions, powders, perfumes.  You may wear deodorant.  Do not bring valuables to the hospital. Contacts, dentures or bridgework may not be worn into surgery.  Leave suitcase in the car. After Surgery it may be brought to your room. For patients being admitted to the hospital, checkout time is 11:00am the day of discharge.  Home with husband Reuel BoomDaniel cell [phone].

## 2014-06-24 ENCOUNTER — Encounter (HOSPITAL_COMMUNITY): Payer: Self-pay

## 2014-06-24 ENCOUNTER — Encounter (HOSPITAL_COMMUNITY)
Admission: RE | Admit: 2014-06-24 | Discharge: 2014-06-24 | Disposition: A | Discharge status: Home / Self Care | Payer: 59 | Source: Ambulatory Visit | Attending: Obstetrics and Gynecology | Admitting: Obstetrics and Gynecology

## 2014-06-24 DIAGNOSIS — Z01818 Encounter for other preprocedural examination: Secondary | ICD-10-CM | POA: Insufficient documentation

## 2014-06-24 HISTORY — DX: Anxiety disorder, unspecified: F41.9

## 2014-06-24 HISTORY — DX: Gastro-esophageal reflux disease without esophagitis: K21.9

## 2014-06-24 LAB — CBC
HEMATOCRIT: 31.5 % — AB (ref 36.0–46.0)
HEMOGLOBIN: 10.2 g/dL — AB (ref 12.0–15.0)
MCH: 26.4 pg (ref 26.0–34.0)
MCHC: 32.4 g/dL (ref 30.0–36.0)
MCV: 81.6 fL (ref 78.0–100.0)
PLATELETS: 213 10*3/uL (ref 150–400)
RBC: 3.86 MIL/uL — AB (ref 3.87–5.11)
RDW: 16.5 % — ABNORMAL HIGH (ref 11.5–15.5)
WBC: 8.9 10*3/uL (ref 4.0–10.5)

## 2014-06-24 LAB — ABO/RH: ABO/RH(D): B POS

## 2014-06-25 ENCOUNTER — Encounter (HOSPITAL_COMMUNITY): Payer: Self-pay | Admitting: Obstetrics and Gynecology

## 2014-06-25 DIAGNOSIS — O34219 Maternal care for unspecified type scar from previous cesarean delivery: Secondary | ICD-10-CM | POA: Diagnosis present

## 2014-06-25 DIAGNOSIS — Z3483 Encounter for supervision of other normal pregnancy, third trimester: Secondary | ICD-10-CM

## 2014-06-25 HISTORY — DX: Encounter for supervision of other normal pregnancy, third trimester: Z34.83

## 2014-06-25 LAB — RPR: RPR: NONREACTIVE
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
## 2014-06-26 ENCOUNTER — Encounter (HOSPITAL_COMMUNITY)
Admission: RE | Disposition: A | Discharge status: Home / Self Care | Payer: Self-pay | Source: Ambulatory Visit | Attending: Obstetrics and Gynecology

## 2014-06-26 ENCOUNTER — Inpatient Hospital Stay (HOSPITAL_COMMUNITY)
Admission: RE | Admit: 2014-06-26 | Discharge: 2014-06-29 | DRG: 766 | Disposition: A | Discharge status: Home / Self Care | Payer: 59 | Source: Ambulatory Visit | Attending: Obstetrics and Gynecology | Admitting: Obstetrics and Gynecology

## 2014-06-26 ENCOUNTER — Inpatient Hospital Stay (HOSPITAL_COMMUNITY): Payer: 59 | Admitting: Anesthesiology

## 2014-06-26 ENCOUNTER — Encounter (HOSPITAL_COMMUNITY): Payer: Self-pay

## 2014-06-26 DIAGNOSIS — K219 Gastro-esophageal reflux disease without esophagitis: Secondary | ICD-10-CM | POA: Diagnosis present

## 2014-06-26 DIAGNOSIS — Z3A39 39 weeks gestation of pregnancy: Secondary | ICD-10-CM | POA: Diagnosis present

## 2014-06-26 DIAGNOSIS — O99824 Streptococcus B carrier state complicating childbirth: Secondary | ICD-10-CM | POA: Diagnosis present

## 2014-06-26 DIAGNOSIS — O34219 Maternal care for unspecified type scar from previous cesarean delivery: Secondary | ICD-10-CM | POA: Diagnosis present

## 2014-06-26 DIAGNOSIS — O3421 Maternal care for scar from previous cesarean delivery: Secondary | ICD-10-CM | POA: Diagnosis present

## 2014-06-26 DIAGNOSIS — Z98891 History of uterine scar from previous surgery: Secondary | ICD-10-CM

## 2014-06-26 DIAGNOSIS — O9962 Diseases of the digestive system complicating childbirth: Secondary | ICD-10-CM | POA: Diagnosis present

## 2014-06-26 DIAGNOSIS — Z3483 Encounter for supervision of other normal pregnancy, third trimester: Secondary | ICD-10-CM | POA: Diagnosis present

## 2014-06-26 HISTORY — DX: Encounter for supervision of other normal pregnancy, third trimester: Z34.83

## 2014-06-26 HISTORY — DX: History of uterine scar from previous surgery: Z98.891

## 2014-06-26 LAB — PREPARE RBC (CROSSMATCH)

## 2014-06-26 SURGERY — Surgical Case
Anesthesia: Spinal

## 2014-06-26 MED ORDER — PHENYLEPHRINE 40 MCG/ML (10ML) SYRINGE FOR IV PUSH (FOR BLOOD PRESSURE SUPPORT)
PREFILLED_SYRINGE | INTRAVENOUS | Status: AC
  Filled 2014-06-26: qty 10

## 2014-06-26 MED ORDER — ACETAMINOPHEN 500 MG PO TABS
1000.0000 mg | ORAL_TABLET | Freq: Four times a day (QID) | ORAL | Status: AC
  Administered 2014-06-26 – 2014-06-27 (×2): 1000 mg via ORAL
  Filled 2014-06-26 (×3): qty 2

## 2014-06-26 MED ORDER — KETOROLAC TROMETHAMINE 30 MG/ML IJ SOLN
30.0000 mg | Freq: Four times a day (QID) | INTRAMUSCULAR | Status: AC | PRN
  Administered 2014-06-26: 30 mg via INTRAVENOUS
  Filled 2014-06-26: qty 1

## 2014-06-26 MED ORDER — BUPIVACAINE HCL (PF) 0.25 % IJ SOLN
INTRAMUSCULAR | Status: AC
  Filled 2014-06-26: qty 30

## 2014-06-26 MED ORDER — SIMETHICONE 80 MG PO CHEW
80.0000 mg | CHEWABLE_TABLET | ORAL | Status: DC | PRN
  Filled 2014-06-26: qty 1

## 2014-06-26 MED ORDER — ZOLPIDEM TARTRATE 5 MG PO TABS: 5.0000 mg | ORAL_TABLET | Freq: Every evening | ORAL | Status: DC | PRN

## 2014-06-26 MED ORDER — LACTATED RINGERS IV SOLN
INTRAVENOUS | Status: DC | PRN
  Administered 2014-06-26 (×2): via INTRAVENOUS

## 2014-06-26 MED ORDER — OXYTOCIN 10 UNIT/ML IJ SOLN
INTRAMUSCULAR | Status: AC
  Filled 2014-06-26: qty 4

## 2014-06-26 MED ORDER — 0.9 % SODIUM CHLORIDE (POUR BTL) OPTIME
TOPICAL | Status: DC | PRN
  Administered 2014-06-26: 1000 mL

## 2014-06-26 MED ORDER — NALOXONE HCL 1 MG/ML IJ SOLN
1.0000 ug/kg/h | INTRAMUSCULAR | Status: DC | PRN
  Filled 2014-06-26: qty 2

## 2014-06-26 MED ORDER — LANOLIN HYDROUS EX OINT: 1.0000 "application " | TOPICAL_OINTMENT | CUTANEOUS | Status: DC | PRN

## 2014-06-26 MED ORDER — FENTANYL CITRATE 0.05 MG/ML IJ SOLN: 25.0000 ug | INTRAMUSCULAR | Status: DC | PRN

## 2014-06-26 MED ORDER — NATALCARE PIC 60-1 MG PO TABS: 1.0000 | ORAL_TABLET | Freq: Every day | ORAL | Status: DC

## 2014-06-26 MED ORDER — DIBUCAINE 1 % RE OINT: 1.0000 "application " | TOPICAL_OINTMENT | RECTAL | Status: DC | PRN

## 2014-06-26 MED ORDER — NALBUPHINE HCL 10 MG/ML IJ SOLN: 5.0000 mg | INTRAMUSCULAR | Status: DC | PRN

## 2014-06-26 MED ORDER — NALOXONE HCL 0.4 MG/ML IJ SOLN: 0.4000 mg | INTRAMUSCULAR | Status: DC | PRN

## 2014-06-26 MED ORDER — ONDANSETRON HCL 4 MG PO TABS: 4.0000 mg | ORAL_TABLET | ORAL | Status: DC | PRN

## 2014-06-26 MED ORDER — FENTANYL CITRATE 0.05 MG/ML IJ SOLN
INTRAMUSCULAR | Status: AC
  Filled 2014-06-26: qty 2

## 2014-06-26 MED ORDER — KETOROLAC TROMETHAMINE 30 MG/ML IJ SOLN
INTRAMUSCULAR | Status: AC
  Administered 2014-06-26: 30 mg via INTRAVENOUS
  Filled 2014-06-26: qty 1

## 2014-06-26 MED ORDER — SCOPOLAMINE 1 MG/3DAYS TD PT72
1.0000 | MEDICATED_PATCH | Freq: Once | TRANSDERMAL | Status: DC
  Filled 2014-06-26: qty 1

## 2014-06-26 MED ORDER — MEPERIDINE HCL 25 MG/ML IJ SOLN: 6.2500 mg | INTRAMUSCULAR | Status: DC | PRN

## 2014-06-26 MED ORDER — LACTATED RINGERS IV SOLN
INTRAVENOUS | Status: DC
  Administered 2014-06-27: 02:00:00 via INTRAVENOUS

## 2014-06-26 MED ORDER — MORPHINE SULFATE (PF) 0.5 MG/ML IJ SOLN
INTRAMUSCULAR | Status: DC | PRN
  Administered 2014-06-26: .15 mg via INTRATHECAL

## 2014-06-26 MED ORDER — ONDANSETRON HCL 4 MG/2ML IJ SOLN
INTRAMUSCULAR | Status: DC | PRN
  Administered 2014-06-26: 4 mg via INTRAVENOUS

## 2014-06-26 MED ORDER — IBUPROFEN 800 MG PO TABS
800.0000 mg | ORAL_TABLET | Freq: Three times a day (TID) | ORAL | Status: DC
  Administered 2014-06-26 – 2014-06-29 (×8): 800 mg via ORAL
  Filled 2014-06-26 (×8): qty 1

## 2014-06-26 MED ORDER — MIDAZOLAM HCL 2 MG/2ML IJ SOLN: 0.5000 mg | Freq: Once | INTRAMUSCULAR | Status: DC | PRN

## 2014-06-26 MED ORDER — SIMETHICONE 80 MG PO CHEW
80.0000 mg | CHEWABLE_TABLET | Freq: Three times a day (TID) | ORAL | Status: DC
  Administered 2014-06-27 – 2014-06-29 (×7): 80 mg via ORAL
  Filled 2014-06-26 (×7): qty 1

## 2014-06-26 MED ORDER — PROMETHAZINE HCL 25 MG/ML IJ SOLN: 6.2500 mg | INTRAMUSCULAR | Status: DC | PRN

## 2014-06-26 MED ORDER — PANTOPRAZOLE SODIUM 40 MG PO TBEC
40.0000 mg | DELAYED_RELEASE_TABLET | Freq: Every day | ORAL | Status: DC
  Administered 2014-06-27 – 2014-06-29 (×3): 40 mg via ORAL
  Filled 2014-06-26 (×3): qty 1

## 2014-06-26 MED ORDER — NALBUPHINE HCL 10 MG/ML IJ SOLN: 5.0000 mg | Freq: Once | INTRAMUSCULAR | Status: AC | PRN

## 2014-06-26 MED ORDER — DIPHENHYDRAMINE HCL 50 MG/ML IJ SOLN: 12.5000 mg | INTRAMUSCULAR | Status: DC | PRN

## 2014-06-26 MED ORDER — BUPIVACAINE IN DEXTROSE 0.75-8.25 % IT SOLN
INTRATHECAL | Status: DC | PRN
  Administered 2014-06-26: 1.6 mL via INTRATHECAL

## 2014-06-26 MED ORDER — DIPHENHYDRAMINE HCL 25 MG PO CAPS: 25.0000 mg | ORAL_CAPSULE | Freq: Four times a day (QID) | ORAL | Status: DC | PRN

## 2014-06-26 MED ORDER — CEFAZOLIN SODIUM-DEXTROSE 2-3 GM-% IV SOLR
INTRAVENOUS | Status: DC | PRN
  Administered 2014-06-26: 2 g via INTRAVENOUS

## 2014-06-26 MED ORDER — CEFAZOLIN SODIUM-DEXTROSE 2-3 GM-% IV SOLR
INTRAVENOUS | Status: AC
  Filled 2014-06-26: qty 50

## 2014-06-26 MED ORDER — KETOROLAC TROMETHAMINE 30 MG/ML IJ SOLN: 30.0000 mg | Freq: Four times a day (QID) | INTRAMUSCULAR | Status: AC | PRN

## 2014-06-26 MED ORDER — SODIUM CHLORIDE 0.9 % IJ SOLN: 3.0000 mL | INTRAMUSCULAR | Status: DC | PRN

## 2014-06-26 MED ORDER — LACTATED RINGERS IV SOLN
INTRAVENOUS | Status: DC
  Administered 2014-06-26: 09:00:00 via INTRAVENOUS

## 2014-06-26 MED ORDER — PHENYLEPHRINE 8 MG IN D5W 100 ML (0.08MG/ML) PREMIX OPTIME
INJECTION | INTRAVENOUS | Status: DC | PRN
  Administered 2014-06-26: 40 ug/min via INTRAVENOUS

## 2014-06-26 MED ORDER — PRENATAL MULTIVITAMIN CH
1.0000 | ORAL_TABLET | Freq: Every day | ORAL | Status: DC
  Administered 2014-06-27 – 2014-06-28 (×2): 1 via ORAL
  Filled 2014-06-26 (×2): qty 1

## 2014-06-26 MED ORDER — OXYCODONE-ACETAMINOPHEN 5-325 MG PO TABS
2.0000 | ORAL_TABLET | ORAL | Status: DC | PRN
  Administered 2014-06-28 – 2014-06-29 (×3): 2 via ORAL
  Filled 2014-06-26 (×4): qty 2

## 2014-06-26 MED ORDER — MENTHOL 3 MG MT LOZG: 1.0000 | LOZENGE | OROMUCOSAL | Status: DC | PRN

## 2014-06-26 MED ORDER — SCOPOLAMINE 1 MG/3DAYS TD PT72
1.0000 | MEDICATED_PATCH | Freq: Once | TRANSDERMAL | Status: DC
  Administered 2014-06-26: 1.5 mg via TRANSDERMAL

## 2014-06-26 MED ORDER — ONDANSETRON HCL 4 MG/2ML IJ SOLN
INTRAMUSCULAR | Status: AC
  Filled 2014-06-26: qty 2

## 2014-06-26 MED ORDER — MORPHINE SULFATE 0.5 MG/ML IJ SOLN
INTRAMUSCULAR | Status: AC
Start: 2014-06-26 — End: 2014-06-26
  Filled 2014-06-26: qty 10

## 2014-06-26 MED ORDER — SENNOSIDES-DOCUSATE SODIUM 8.6-50 MG PO TABS
2.0000 | ORAL_TABLET | ORAL | Status: DC
  Administered 2014-06-27 – 2014-06-28 (×3): 2 via ORAL
  Filled 2014-06-26 (×4): qty 2

## 2014-06-26 MED ORDER — OXYTOCIN 40 UNITS IN LACTATED RINGERS INFUSION - SIMPLE MED: 62.5000 mL/h | INTRAVENOUS | Status: AC

## 2014-06-26 MED ORDER — DIPHENHYDRAMINE HCL 25 MG PO CAPS: 25.0000 mg | ORAL_CAPSULE | ORAL | Status: DC | PRN

## 2014-06-26 MED ORDER — PHENYLEPHRINE HCL 10 MG/ML IJ SOLN
INTRAMUSCULAR | Status: DC | PRN
  Administered 2014-06-26 (×3): 40 ug via INTRAVENOUS

## 2014-06-26 MED ORDER — SCOPOLAMINE 1 MG/3DAYS TD PT72
MEDICATED_PATCH | TRANSDERMAL | Status: AC
  Administered 2014-06-26: 1.5 mg via TRANSDERMAL
  Filled 2014-06-26: qty 1

## 2014-06-26 MED ORDER — FENTANYL CITRATE 0.05 MG/ML IJ SOLN
INTRAMUSCULAR | Status: DC | PRN
  Administered 2014-06-26: 25 ug via INTRATHECAL

## 2014-06-26 MED ORDER — ONDANSETRON HCL 4 MG/2ML IJ SOLN: 4.0000 mg | Freq: Three times a day (TID) | INTRAMUSCULAR | Status: DC | PRN

## 2014-06-26 MED ORDER — CEFAZOLIN SODIUM-DEXTROSE 2-3 GM-% IV SOLR: 2.0000 g | INTRAVENOUS | Status: DC

## 2014-06-26 MED ORDER — IBUPROFEN 600 MG PO TABS: 600.0000 mg | ORAL_TABLET | Freq: Four times a day (QID) | ORAL | Status: DC | PRN

## 2014-06-26 MED ORDER — OXYCODONE-ACETAMINOPHEN 5-325 MG PO TABS
1.0000 | ORAL_TABLET | ORAL | Status: DC | PRN
  Administered 2014-06-27 – 2014-06-28 (×5): 1 via ORAL
  Filled 2014-06-26 (×5): qty 1

## 2014-06-26 MED ORDER — ONDANSETRON HCL 4 MG/2ML IJ SOLN: 4.0000 mg | INTRAMUSCULAR | Status: DC | PRN

## 2014-06-26 MED ORDER — OXYTOCIN 10 UNIT/ML IJ SOLN
40.0000 [IU] | INTRAVENOUS | Status: DC | PRN
  Administered 2014-06-26: 40 [IU] via INTRAVENOUS

## 2014-06-26 MED ORDER — WITCH HAZEL-GLYCERIN EX PADS: 1.0000 "application " | MEDICATED_PAD | CUTANEOUS | Status: DC | PRN

## 2014-06-26 MED ORDER — PHENYLEPHRINE 8 MG IN D5W 100 ML (0.08MG/ML) PREMIX OPTIME
INJECTION | INTRAVENOUS | Status: AC
  Filled 2014-06-26: qty 100

## 2014-06-26 MED ORDER — ACETAMINOPHEN 160 MG/5ML PO SOLN: 325.0000 mg | ORAL | Status: DC | PRN

## 2014-06-26 MED ORDER — ACETAMINOPHEN 325 MG PO TABS: 325.0000 mg | ORAL_TABLET | ORAL | Status: DC | PRN

## 2014-06-26 MED ORDER — KETOROLAC TROMETHAMINE 30 MG/ML IJ SOLN
30.0000 mg | Freq: Once | INTRAMUSCULAR | Status: AC | PRN
  Administered 2014-06-26: 30 mg via INTRAVENOUS

## 2014-06-26 MED ORDER — LACTATED RINGERS IV SOLN
INTRAVENOUS | Status: DC
Start: 2014-06-26 — End: 2014-06-26
  Administered 2014-06-26: 07:00:00 via INTRAVENOUS

## 2014-06-26 MED ORDER — SIMETHICONE 80 MG PO CHEW
80.0000 mg | CHEWABLE_TABLET | ORAL | Status: DC
  Administered 2014-06-27 – 2014-06-28 (×3): 80 mg via ORAL
  Filled 2014-06-26 (×2): qty 1

## 2014-06-26 SURGICAL SUPPLY — 39 items
APL SKNCLS STERI-STRIP NONHPOA (GAUZE/BANDAGES/DRESSINGS) ×1
BENZOIN TINCTURE PRP APPL 2/3 (GAUZE/BANDAGES/DRESSINGS) ×2 IMPLANT
CLAMP CORD UMBIL (MISCELLANEOUS) IMPLANT
CLOSURE WOUND 1/2 X4 (GAUZE/BANDAGES/DRESSINGS) ×1
CLOTH BEACON ORANGE TIMEOUT ST (SAFETY) ×3 IMPLANT
CONTAINER PREFILL 10% NBF 15ML (MISCELLANEOUS) IMPLANT
DRAPE SHEET LG 3/4 BI-LAMINATE (DRAPES) IMPLANT
DRSG OPSITE POSTOP 4X10 (GAUZE/BANDAGES/DRESSINGS) ×3 IMPLANT
DURAPREP 26ML APPLICATOR (WOUND CARE) ×3 IMPLANT
ELECT REM PT RETURN 9FT ADLT (ELECTROSURGICAL) ×3
ELECTRODE REM PT RTRN 9FT ADLT (ELECTROSURGICAL) ×1 IMPLANT
EXTRACTOR VACUUM M CUP 4 TUBE (SUCTIONS) ×1 IMPLANT
EXTRACTOR VACUUM M CUP 4' TUBE (SUCTIONS) ×1
GLOVE BIO SURGEON STRL SZ 6.5 (GLOVE) ×2 IMPLANT
GLOVE BIO SURGEONS STRL SZ 6.5 (GLOVE) ×1
GLOVE BIOGEL M 7.0 STRL (GLOVE) ×6 IMPLANT
GLOVE ORTHO TXT STRL SZ7.5 (GLOVE) ×2 IMPLANT
GOWN STRL REUS W/TWL LRG LVL3 (GOWN DISPOSABLE) ×6 IMPLANT
KIT ABG SYR 3ML LUER SLIP (SYRINGE) IMPLANT
NDL HYPO 25X5/8 SAFETYGLIDE (NEEDLE) IMPLANT
NEEDLE HYPO 25X5/8 SAFETYGLIDE (NEEDLE) IMPLANT
NS IRRIG 1000ML POUR BTL (IV SOLUTION) ×3 IMPLANT
PACK C SECTION WH (CUSTOM PROCEDURE TRAY) ×3 IMPLANT
PAD OB MATERNITY 4.3X12.25 (PERSONAL CARE ITEMS) ×3 IMPLANT
RTRCTR C-SECT PINK 25CM LRG (MISCELLANEOUS) ×3 IMPLANT
STAPLER VISISTAT 35W (STAPLE) IMPLANT
STRIP CLOSURE SKIN 1/2X4 (GAUZE/BANDAGES/DRESSINGS) ×1 IMPLANT
SUT MNCRL 0 VIOLET CTX 36 (SUTURE) ×2 IMPLANT
SUT MONOCRYL 0 CTX 36 (SUTURE) ×4
SUT PLAIN 1 NONE 54 (SUTURE) IMPLANT
SUT PLAIN 2 0 XLH (SUTURE) ×3 IMPLANT
SUT VIC AB 0 CT1 27 (SUTURE) ×6
SUT VIC AB 0 CT1 27XBRD ANBCTR (SUTURE) ×2 IMPLANT
SUT VIC AB 2-0 CT1 27 (SUTURE) ×3
SUT VIC AB 2-0 CT1 TAPERPNT 27 (SUTURE) ×1 IMPLANT
SUT VIC AB 4-0 KS 27 (SUTURE) ×2 IMPLANT
SYR BULB IRRIGATION 50ML (SYRINGE) ×3 IMPLANT
TOWEL OR 17X24 6PK STRL BLUE (TOWEL DISPOSABLE) ×3 IMPLANT
TRAY FOLEY CATH 14FR (SET/KITS/TRAYS/PACK) ×3 IMPLANT

## 2014-06-26 NOTE — Anesthesia Postprocedure Evaluation (Signed)
  Anesthesia Post-op Note  Patient: Margaret CarasHolly C Blanchet  Procedure(s) Performed: Procedure(s): CESAREAN SECTION (N/A)  Patient Location: Mother/Baby  Anesthesia Type:Spinal  Level of Consciousness: awake, alert , oriented and patient cooperative  Airway and Oxygen Therapy: Patient Spontanous Breathing  Post-op Pain: mild  Post-op Assessment: Patient's Cardiovascular Status Stable, Respiratory Function Stable, No headache, No backache, No residual numbness and No residual motor weakness  Post-op Vital Signs: stable  Last Vitals:  Filed Vitals:   06/26/14 1030  BP: 109/61  Pulse: 86  Temp: 36.8 C  Resp: 16    Complications: No apparent anesthesia complications

## 2014-06-26 NOTE — Transfer of Care (Signed)
Immediate Anesthesia Transfer of Care Note  Patient: Margaret CarasHolly C Waterson  Procedure(s) Performed: Procedure(s): CESAREAN SECTION (N/A)  Patient Location: PACU  Anesthesia Type:Spinal  Level of Consciousness: awake, alert  and oriented  Airway & Oxygen Therapy: Patient Spontanous Breathing  Post-op Assessment: Report given to RN and Post -op Vital signs reviewed and stable  Post vital signs: Reviewed and stable  Last Vitals:  Filed Vitals:   06/26/14 0945  BP: 101/65  Pulse: 87  Temp:   Resp: 16    Complications: No apparent anesthesia complications

## 2014-06-26 NOTE — Anesthesia Preprocedure Evaluation (Signed)
Anesthesia Evaluation  Patient identified by MRN, date of birth, ID band Patient awake    Reviewed: Allergy & Precautions, H&P , NPO status , Patient's Chart, lab work & pertinent test results  Airway Mallampati: III       Dental   Pulmonary  breath sounds clear to auscultation        Cardiovascular Exercise Tolerance: Good Rhythm:regular Rate:Normal     Neuro/Psych    GI/Hepatic GERD-  ,  Endo/Other    Renal/GU      Musculoskeletal   Abdominal   Peds  Hematology   Anesthesia Other Findings   Reproductive/Obstetrics (+) Pregnancy                             Anesthesia Physical Anesthesia Plan  ASA: III  Anesthesia Plan: Spinal   Post-op Pain Management:    Induction:   Airway Management Planned:   Additional Equipment:   Intra-op Plan:   Post-operative Plan:   Informed Consent: I have reviewed the patients History and Physical, chart, labs and discussed the procedure including the risks, benefits and alternatives for the proposed anesthesia with the patient or authorized representative who has indicated his/her understanding and acceptance.     Plan Discussed with: Anesthesiologist, CRNA and Surgeon  Anesthesia Plan Comments:         Anesthesia Quick Evaluation

## 2014-06-26 NOTE — Anesthesia Postprocedure Evaluation (Signed)
Anesthesia Post Note  Patient: Margaret Campbell  Procedure(s) Performed: Procedure(s) (LRB): CESAREAN SECTION (N/A)  Anesthesia type: Spinal  Patient location: PACU  Post pain: Pain level controlled  Post assessment: Post-op Vital signs reviewed  Last Vitals:  Filed Vitals:   06/26/14 0845  BP: 97/36  Pulse: 88  Temp:   Resp: 16    Post vital signs: Reviewed  Level of consciousness: awake  Complications: No apparent anesthesia complications

## 2014-06-26 NOTE — Brief Op Note (Signed)
06/26/2014  8:33 AM  PATIENT:  Margaret Campbell  34 y.o. female  PRE-OPERATIVE DIAGNOSIS:  Repeat C/Section  POST-OPERATIVE DIAGNOSIS:  Repeat cesarean section  PROCEDURE:  Procedure(s): CESAREAN SECTION (N/A)  SURGEON:  Surgeon(s) and Role:    * Sherian ReinJody Bovard-Stuckert, MD - Primary    * Lavina Hammanodd Meisinger, MD - Assisting  ANESTHESIA:   spinal  EBL:  Total I/O In: 1600 [I.V.:1600] Out: 800 [Urine:200; Blood:600]  FINDINGS: viable female infant @ 7:51, apgar 9/10, wt pending, nl uterus, tubes and ovaries.    BLOOD ADMINISTERED:none  DRAINS: Urinary Catheter (Foley)   LOCAL MEDICATIONS USED:  NONE  SPECIMEN:  Source of Specimen:  Placenta  DISPOSITION OF SPECIMEN:  L&D  COUNTS:  YES  TOURNIQUET:  * No tourniquets in log *  DICTATION: .Other Dictation: Dictation Number [id number]  PLAN OF CARE: Admit to inpatient   PATIENT DISPOSITION:  PACU - hemodynamically stable.   Delay start of Pharmacological VTE agent (>24hrs) due to surgical blood loss or risk of bleeding: not applicable

## 2014-06-26 NOTE — Interval H&P Note (Signed)
History and Physical Interval Note:  06/26/2014 7:10 AM  Limited BrandsHolly C Campbell  has presented today for surgery, with the diagnosis of Repeat C/Section  The various methods of treatment have been discussed with the patient and family. After consideration of risks, benefits and other options for treatment, the patient has consented to  Procedure(s): CESAREAN SECTION (N/A) as a surgical intervention .  The patient's history has been reviewed, patient examined, no change in status, stable for surgery.  I have reviewed the patient's chart and labs.  Questions were answered to the patient's satisfaction.     Bovard-Stuckert, Brylei Pedley

## 2014-06-26 NOTE — Addendum Note (Signed)
Addendum  created 06/26/14 1246 by Earmon PhoenixValerie P Skylah Delauter, CRNA   Modules edited: Notes Section   Notes Section:  File: [id number]

## 2014-06-26 NOTE — Lactation Note (Addendum)
This note was copied from the [facility]. Lactation Consultation Note: Initial visit with mom. Baby now 6 hours old. She reports that baby fed great right after delivery and again about 1 1/2 hours ago. Asleep in bassinet at present. Reviewed feeding cues and encouraged to feed whenever she sees them. Mom reports some pinching when baby latched on the last time. Discussed making sure mouth is wide open and bottom lip is flanged. [facility] needed. Mom reports that first baby went to NICU for low blood sugars and never nursed very well, She pumped for 8 months and bottled fed EBM for 11 months  Concerned about how much baby is getting. Reviewed weight loss, diaper changes and baby content after feedings. 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 BF brochure given with resources for support after DC. No questions at present. To call prn for assist  Patient Name: Margaret Sunday CornHolly Iannacone ZOXWR'UToday's Date: 06/26/2014 Reason for consult: Initial assessment   Maternal Data Formula Feeding for Exclusion: No Has patient been taught Hand Expression?: Yes Does the patient have breastfeeding experience prior to this delivery?: Yes  Feeding    LATCH Score/Interventions                      Lactation Tools Discussed/Used     Consult Status Consult Status: Follow-up Date: 06/27/14 Follow-up type: In-patient    Pamelia HoitWeeks, Sheridan Gettel D 06/26/2014, 2:48 PM

## 2014-06-26 NOTE — Transfer of Care (Deleted)
Immediate Anesthesia Transfer of Care Note  Patient: Margaret Campbell  Procedure(s) Performed: Procedure(s): CESAREAN SECTION (N/A)  Patient Location: PACU  Anesthesia Type:Spinal  Level of Consciousness: awake, alert  and oriented  Airway & Oxygen Therapy: Patient Spontanous Breathing  Post-op Assessment: Report given to RN and Post -op Vital signs reviewed and stable  Post vital signs: Reviewed and stable  Last Vitals:  Filed Vitals:   06/26/14 0626  BP: 117/66  Pulse:   Temp:   Resp:     Complications: No apparent anesthesia complications

## 2014-06-26 NOTE — Anesthesia Procedure Notes (Signed)
Spinal Patient location during procedure: OR Start time: 06/26/2014 7:28 AM Staffing Anesthesiologist: Brayton CavesJACKSON, Adetokunbo Mccadden Performed by: anesthesiologist  Preanesthetic Checklist Completed: patient identified, site marked, surgical consent, pre-op evaluation, timeout performed, IV checked, risks and benefits discussed and monitors and equipment checked Spinal Block Patient position: sitting Prep: DuraPrep Patient monitoring: heart rate, cardiac monitor, continuous pulse ox and blood pressure Approach: midline Location: L3-4 Injection technique: single-shot Needle Needle type: Sprotte  Needle gauge: 24 G Needle length: 9 cm Assessment Sensory level: T4 Additional Notes Patient identified.  Risk benefits discussed including failed block, incomplete pain control, headache, nerve damage, paralysis, blood pressure changes, nausea, vomiting, reactions to medication both toxic or allergic, and postpartum back pain.  Patient expressed understanding and wished to proceed.  All questions were answered.  Sterile technique used throughout procedure.  CSF was clear.  No parasthesia or other complications.  Please see nursing notes for vital signs.

## 2014-06-26 NOTE — Op Note (Signed)
[name]:  Margaret Campbell, Margaret Campbell                ACCOUNT NO.:  [id number]  MEDICAL RECORD NO.:  [id number]  LOCATION:  9134                          FACILITY:  [facility]  PHYSICIAN:  Sherron MondayJody Bovard, MD        DATE OF BIRTH:  1980/05/19  DATE OF PROCEDURE:  06/26/2014 DATE OF DISCHARGE:                              OPERATIVE REPORT   PREOPERATIVE DIAGNOSES:  Intrauterine pregnancy at term, history of low- transverse cesarean section, declines vaginal birth after cesarean section for repeat low transverse cesarean section.  POSTOPERATIVE DIAGNOSES:  Intrauterine pregnancy at term, history of low- transverse cesarean section, declines vaginal birth after cesarean section for repeat low transverse cesarean section, delivered.  PROCEDURE:  Transverse cesarean section.  SURGEON:  Sherron MondayJody Bovard, MD  ASSISTANT:  Zenaida Nieceodd D. Meisinger, MD  ANESTHESIA:  Spinal.  EBL:  Approximately 600 mL.  URINE OUTPUT:  200 mL.  IV FLUIDS:  1600 mL.  COMPLICATIONS:  None.  PATHOLOGY:  Placenta to L and D.  FINDINGS:  Viable female infant at 7:51 a.m. with Apgars of 9 and 10 and weight pending at the time of dictation.  Postpartum uterus, tubes, and ovaries were noted.  PROCEDURE IN DETAIL:  After informed consent was reviewed with the patient and her husband, she was transported back to the operating room, where spinal anesthesia was induced, found to be adequate.  She was then returned to the supine position with a leftward tilt.  Foley catheter was sterilely placed.  She was prepped and draped in the normal sterile fashion.  Pfannenstiel skin incision was made at the level of the previous incision after verification of Anesthesia and appropriate time- out was performed.  This incision was carried through the underlying layer of fascia sharply.  The fascia was incised in the midline.  The incision was extended laterally with Mayo scissors.  Superior aspect of the fascial incision was grasped with Kocher clamps,  elevated, and the rectus muscles were dissected off both bluntly and sharply.  Midline was easily identified and the peritoneum was entered bluntly.  This incision was extended superiorly and inferiorly with good visualization of the bladder.  The Alexis skin retractor was placed carefully making sure no bowel was entrapped.  The uterus was incised in transverse fashion. Infant was delivered with the aid of the vacuum.  Nose and mouth were suctioned.  Infant was handed off to the awaiting pediatric staff.  The cord was clamped and cut.  The placenta was then expressed.  Placenta was given to the cord blood collection.  The uterus was cleared of all clot and debris.  The uterine incision was closed with 2 layers of 0 Monocryl, first of which was running locked and the second was an imbricating layer.  This was noted to be hemostatic.  The gutters were cleared of all clot and debris.  Inspection revealed normal anatomy.  An Alexis retractor was removed.  The peritoneum was reapproximated with 2- 0 Vicryl in a running fashion.  Subfascial planes were inspected, found to be hemostatic.  The fascia was then closed from either end overlapping in the midline with 0 Vicryl.  Subcuticular adipose layer was made hemostatic with  Bovie cautery.  The dead space was closed with 3-0 plain gut.  The skin was closed with 4-0 Vicryl on a Keith needle. Benzoin and Steri-Strips applied.  Sponge, lap, and needle count was correct x2.  The patient tolerated procedure well and was transported to the PACU in stable condition.     Sherron Monday, MD     JB/MEDQ  D:  06/26/2014  T:  06/26/2014  Job:  [id number]

## 2014-06-26 NOTE — Addendum Note (Signed)
Addendum  created 06/26/14 1046 by Brayton CavesFreeman Elgar Scoggins, MD   Modules edited: Anesthesia Attestations

## 2014-06-26 NOTE — Transfer of Care (Deleted)
Immediate Anesthesia Transfer of Care Note  Patient: Margaret Campbell  Procedure(s) Performed: Procedure(s): CESAREAN SECTION (N/A)  Patient Location: PACU  Anesthesia Type:Spinal  Level of Consciousness: awake, alert  and oriented  Airway & Oxygen Therapy: Patient Spontanous Breathing and Patient connected to nasal cannula oxygen  Post-op Assessment: Report given to RN and Post -op Vital signs reviewed and stable  Post vital signs: Reviewed and stable  Last Vitals:  Filed Vitals:   06/26/14 0626  BP: 117/66  Pulse:   Temp:   Resp:     Complications: No apparent anesthesia complications

## 2014-06-27 LAB — TYPE AND SCREEN
ABO/RH(D): B POS
Antibody Screen: NEGATIVE
UNIT DIVISION: 0
Unit division: 0

## 2014-06-27 LAB — CCBB MATERNAL DONOR DRAW

## 2014-06-27 LAB — CBC
HEMATOCRIT: 27.5 % — AB (ref 36.0–46.0)
Hemoglobin: 8.8 g/dL — ABNORMAL LOW (ref 12.0–15.0)
MCH: 26.5 pg (ref 26.0–34.0)
MCHC: 32 g/dL (ref 30.0–36.0)
MCV: 82.8 fL (ref 78.0–100.0)
Platelets: 182 10*3/uL (ref 150–400)
RBC: 3.32 MIL/uL — AB (ref 3.87–5.11)
RDW: 16.7 % — ABNORMAL HIGH (ref 11.5–15.5)
WBC: 9.9 10*3/uL (ref 4.0–10.5)

## 2014-06-27 LAB — BIRTH TISSUE RECOVERY COLLECTION (PLACENTA DONATION)

## 2014-06-27 NOTE — Progress Notes (Signed)
Patient was referred for history of depression/anxiety. * Referral screened out by Clinical Social Worker because none of the following criteria appear to apply:  ~ History of anxiety/depression during this pregnancy, or of post-partum depression.  ~ Diagnosis of anxiety and/or depression within last 3 years  ~ History of depression due to pregnancy loss/loss of child  OR * Patient's symptoms currently being treated with medication and/or therapy.  Please contact the Clinical Social Worker if needs arise, or by the patient's request. Per RN admission summary.

## 2014-06-27 NOTE — Lactation Note (Signed)
This note was copied from the [facility]. Lactation Consultation Note Mom states BF going better, baby cluster fed last night for short intervals. Mom has baby dressed in outfit, hat, and blanket. Encouraged to unwrap baby for BF and STS as much as possible, and at least not have in blankets and hat. Mom has large pendulum shaped breast, w/Hx; of over production. She pumped w/last baby. Asked when she should start pumping.  Advised mom as long as baby was BF like he was for 20-25 min. As often as he was she wouldn't need to post pump unless baby wasn't BF well or she didn't think her milk supply was good. Mom states she has good colostrum.  Encouraged to elevate breast w/cloth. Asked mom if she wanted hand pump and stated no. Reviewed supply and demand.  Asked about latches and if painful. Mom stated that if she felt a pinch she would adjust his mouth wider. Baby has small mouth. Mom stated she felt like things were going well.  Encouraged to massage breast as baby is BF to express more into mouth unless she has strong let down. Discussed milk coming in and engorgement. Encouraged to call for assistance if needed. Patient Name: Boy Sunday CornHolly Valeriano UJWJX'BToday's Date: 06/27/2014 Reason for consult: Follow-up assessment   Maternal Data    Feeding Feeding Type: Breast Fed Length of feed: 5 min  LATCH Score/Interventions Latch: Repeated attempts needed to sustain latch, nipple held in mouth throughout feeding, stimulation needed to elicit sucking reflex. Intervention(s): Adjust position;Assist with latch;Breast massage;Breast compression  Audible Swallowing: None Intervention(s): Hand expression  Type of Nipple: Everted at rest and after stimulation  Comfort (Breast/Nipple): Soft / non-tender     Hold (Positioning): Assistance needed to correctly position infant at breast and maintain latch. Intervention(s): Support Pillows;Position options;Skin to skin  LATCH Score:  6  Lactation Tools Discussed/Used     Consult Status Consult Status: Follow-up Date: 06/28/14 Follow-up type: In-patient    Charyl DancerCARVER, Arnaldo Heffron G 06/27/2014, 2:44 PM

## 2014-06-27 NOTE — Progress Notes (Signed)
Subjective: Postpartum Day 1: Cesarean Delivery Patient reports incisional pain and tolerating PO.  Nl lochia, pain controlled  Objective: Vital signs in last 24 hours: Temp:  [97.5 F (36.4 C)-98.7 F (37.1 C)] 98.7 F (37.1 C) (03/12 0410) Pulse Rate:  [68-88] 88 (03/12 0410) Resp:  [16-18] 16 (03/12 0410) BP: (95-124)/(46-72) 106/55 mmHg (03/12 0410) SpO2:  [95 %-100 %] 96 % (03/12 0410)  Physical Exam:  General: alert and no distress Lochia: appropriate Uterine Fundus: firm Incision: healing well DVT Evaluation: No evidence of DVT seen on physical exam.   Recent Labs  06/24/14 1125 06/27/14 0554  HGB 10.2* 8.8*  HCT 31.5* 27.5*    Assessment/Plan: Status post Cesarean section. Doing well postoperatively.  Continue current care.  Bovard-Stuckert, Dameer Speiser 06/27/2014, 9:27 AM

## 2014-06-28 MED ORDER — BREAST MILK
ORAL | Status: DC
  Filled 2014-06-28: qty 1

## 2014-06-28 NOTE — Lactation Note (Signed)
[facility] Lactation Consultation Note  Follow up visit made.  Milk in and breasts very full but soft to palpation.  Mom has a history of engorgement with first baby but he was not latching and mom pumped.  Instructed mom to nurse frequently using good breast massage and compression during feeding.  Mom states baby prefers left breast and she is more comfortable on that side.  Suggested she try different positions.  Observed mom latch baby to left breast using football hold.  Baby latched easily and nursed well with many audible swallows.  Comfort gels given with instructions for right nipple tenderness.  Instructed mom to call for pump if baby is not giving her enough softening/comfort.  Patient Name: Margaret Campbell'BToday's Date: 06/28/2014 Reason for consult: Follow-up assessment   Maternal Data    Feeding Feeding Type: Breast Fed Length of feed: 15 min  LATCH Score/Interventions Latch: Grasps breast easily, tongue down, lips flanged, rhythmical sucking.  Audible Swallowing: Spontaneous and intermittent  Type of Nipple: Everted at rest and after stimulation  Comfort (Breast/Nipple): Filling, red/small blisters or bruises, mild/mod discomfort     Hold (Positioning): No assistance needed to correctly position infant at breast. Intervention(s): Breastfeeding basics reviewed;Support Pillows;Position options  LATCH Score: 9  Lactation Tools Discussed/Used     Consult Status Consult Status: Follow-up Date: 06/29/14 Follow-up type: In-patient    Huston FoleyMOULDEN, Capri Veals S 06/28/2014, 3:21 PM

## 2014-06-28 NOTE — Progress Notes (Signed)
Subjective: Postpartum Day 2: Cesarean Delivery Patient reports incisional pain and tolerating PO.  Nl lochia, pain controlled  Objective: Vital signs in last 24 hours: Temp:  [98.2 F (36.8 C)-98.4 F (36.9 C)] 98.2 F (36.8 C) (03/13 0700) Pulse Rate:  [87-102] 87 (03/13 0700) Resp:  [18] 18 (03/13 0700) BP: (99-119)/(55-59) 119/59 mmHg (03/13 0700)  Physical Exam:  General: alert and no distress Lochia: appropriate Uterine Fundus: firm Incision: healing well DVT Evaluation: No evidence of DVT seen on physical exam.   Recent Labs  06/27/14 0554  HGB 8.8*  HCT 27.5*    Assessment/Plan: Status post Cesarean section. Doing well postoperatively.  Continue current care.  Possible circ for baby thi PM.  Bovard-Stuckert, Margaret Campbell 06/28/2014, 9:07 AM

## 2014-06-29 MED ORDER — IBUPROFEN 800 MG PO TABS: 800.0000 mg | ORAL_TABLET | Freq: Three times a day (TID) | ORAL | Status: AC | PRN

## 2014-06-29 MED ORDER — NATALCARE PIC 60-1 MG PO TABS: 1.0000 | ORAL_TABLET | Freq: Every day | ORAL | Status: AC

## 2014-06-29 MED ORDER — OXYCODONE-ACETAMINOPHEN 5-325 MG PO TABS: 1.0000 | ORAL_TABLET | Freq: Four times a day (QID) | ORAL | Status: AC | PRN

## 2014-06-29 NOTE — Lactation Note (Signed)
This note was [facility] the [facility] Lactation Consultation Note  Follow up visit prior to discharge.  Mom is currently breastfeeding baby in football hold.  Baby is nursing actively with audible swallows.  Mom is pumping after some feeds for 5 minutes to relieve pressure.  Mom denies questions at present.  Outpatient lactation services and support encouraged prn.  Patient Name: Margaret Campbell Today's Date: 06/29/2014     Maternal Data    Feeding Feeding Type: Breast Fed Length of feed: 40 min  LATCH Score/Interventions Latch: Grasps breast easily, tongue down, lips flanged, rhythmical sucking.  Audible Swallowing: Spontaneous and intermittent  Type of Nipple: Everted at rest and after stimulation  Comfort (Breast/Nipple): Filling, red/small blisters or bruises, mild/mod discomfort  Problem noted: Filling Interventions (Filling): Double electric pump  Hold (Positioning): No assistance needed to correctly position infant at breast.  LATCH Score: 9  Lactation Tools Discussed/Used     Consult Status      [name], Margaret Flury S 06/29/2014, 10:30 AM

## 2014-06-29 NOTE — Progress Notes (Signed)
Patient c/o tingling and edema in lower extremities. 0 visual change in previous assessment regarding edema of lower extremities, Homans sign negative, 0 redness noted. Dr Hinton RaoBovard-Stuckert called and given report of patients concerns.

## 2014-06-29 NOTE — Progress Notes (Signed)
Subjective: Postpartum Day 3: Cesarean Delivery Patient reports incisional pain and tolerating PO.  Nl lochia, pain controlled.  C/O this am of tingling and swelling in lower extremities  Objective: Vital signs in last 24 hours: Temp:  [98.7 F (37.1 C)-98.8 F (37.1 C)] 98.7 F (37.1 C) (03/14 0529) Pulse Rate:  [90-100] 90 (03/14 0529) Resp:  [18] 18 (03/14 0529) BP: (124-130)/(65-66) 130/65 mmHg (03/14 0529)  Physical Exam:  General: alert and no distress Lochia: appropriate Uterine Fundus: firm Incision: healing well DVT Evaluation: No evidence of DVT seen on physical exam.   Recent Labs  06/27/14 0554  HGB 8.8*  HCT 27.5*    Assessment/Plan: Status post Cesarean section. Doing well postoperatively.  Discharge home with standard precautions and return to clinic in 2 weeks.  D/C with motrin, percocet, PNV.  F/u 6 weeks.  Monitor leg swelling.  Bovard-Stuckert, Marisal Swarey 06/29/2014, 7:41 AM

## 2014-06-29 NOTE — Discharge Summary (Signed)
Obstetric Discharge Summary Reason for Admission: cesarean section Prenatal Procedures: none Intrapartum Procedures: cesarean: low cervical, transverse Postpartum Procedures: none Complications-Operative and Postpartum: none HEMOGLOBIN  Date Value Ref Range Status  06/27/2014 8.8* 12.0 - 15.0 g/dL Final   HCT  Date Value Ref Range Status  06/27/2014 27.5* 36.0 - 46.0 % Final    Physical Exam:  General: alert and no distress Lochia: appropriate Uterine Fundus: firm Incision: healing well DVT Evaluation: No evidence of DVT seen on physical exam.  Discharge Diagnoses: Term Pregnancy-delivered  Discharge Information: Date: 06/29/2014 Activity: pelvic rest Diet: routine Medications: PNV, Ibuprofen and Percocet Condition: stable Instructions: refer to practice specific booklet Discharge to: home Follow-up Information    Follow up with Bovard-Stuckert, Juleon Narang, MD. Schedule an appointment as soon as possible for a visit in 2 weeks.   Specialty:  Obstetrics and Gynecology   Why:  for incision check, 6 wks for postpartum check   Contact information:   [address] [place] [address] [phone]       Newborn Data: Live born female  Birth Weight: 8 lb 8.5 oz (3870 g) APGAR: 9, 10  Home with mother.  Bovard-Stuckert, Byron Peacock 06/29/2014, 8:12 AM

## 2014-06-30 ENCOUNTER — Encounter (HOSPITAL_COMMUNITY): Payer: Self-pay | Admitting: Obstetrics and Gynecology

## 2014-10-21 IMAGING — CR DG CHEST 2V
2 series · 2 of 2 positions shown · non-contrast
Comparison: 04/02/2012

CLINICAL DATA: Cough

CHEST - 1 VIEW

[view not recorded (1 of 2)]
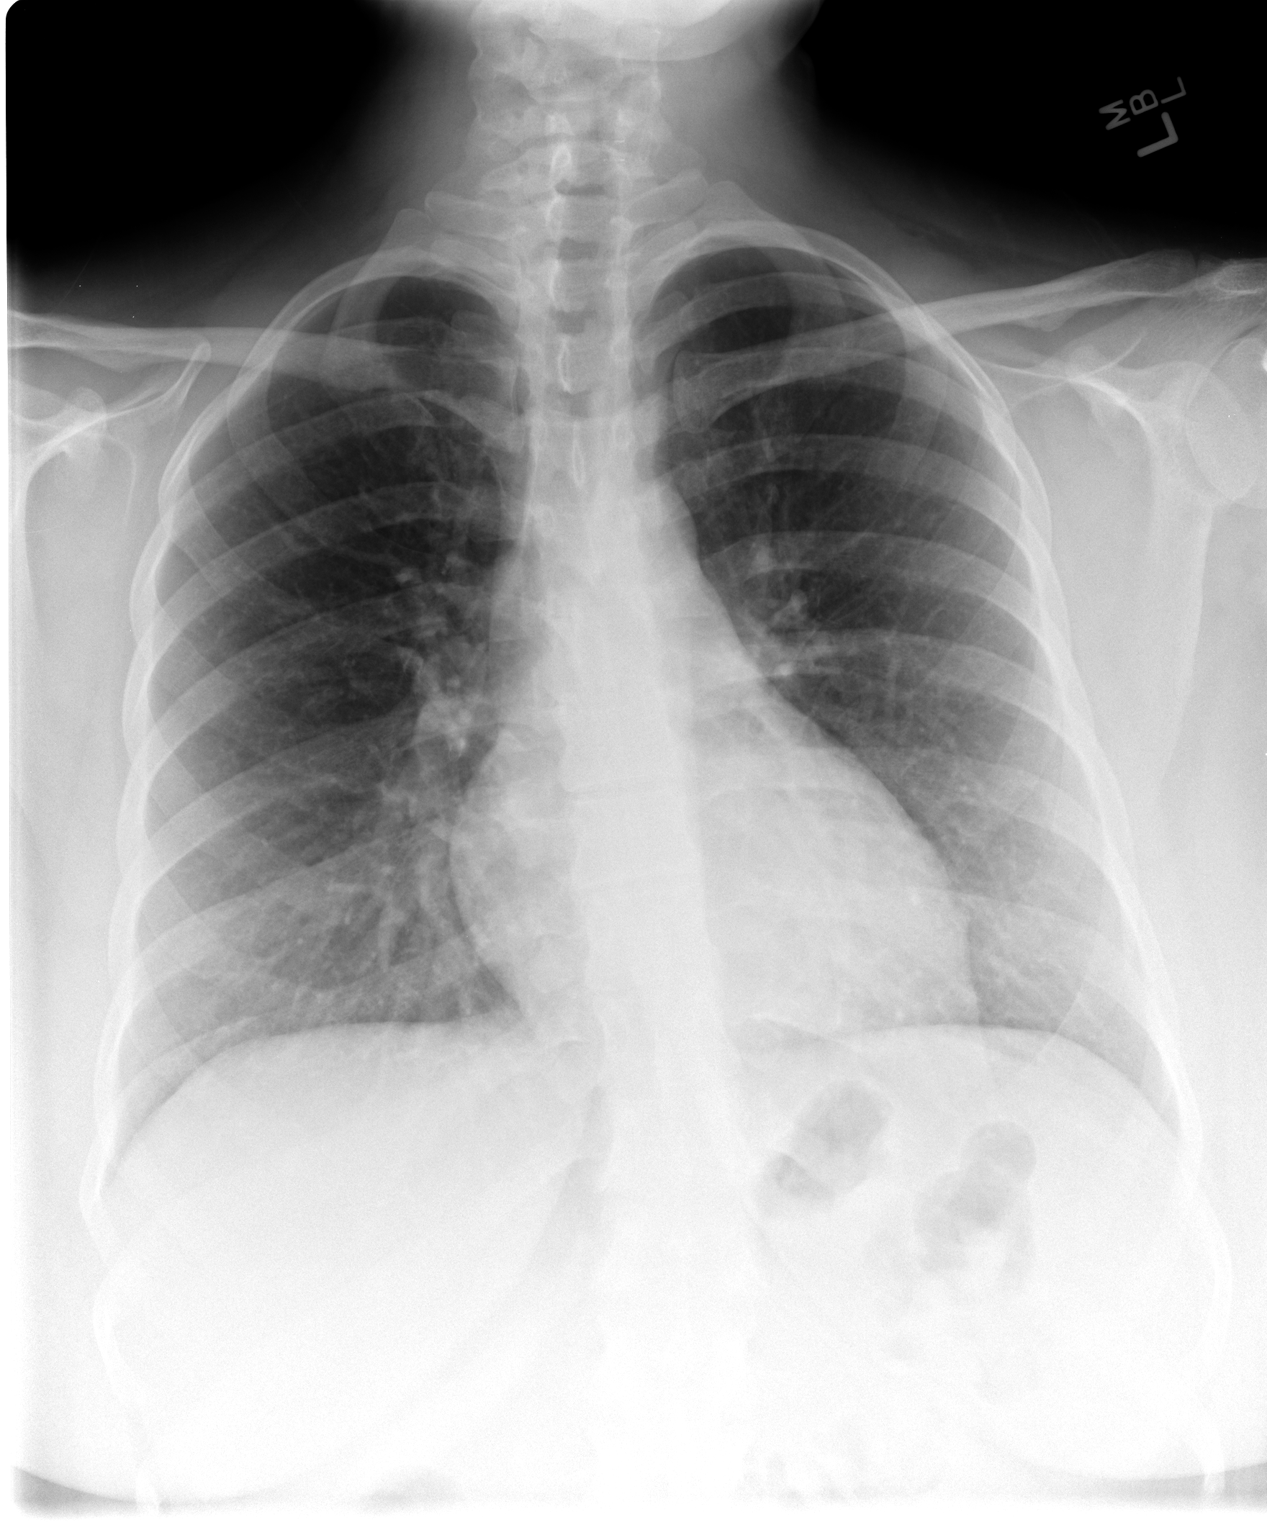

[view not recorded (2 of 2)]
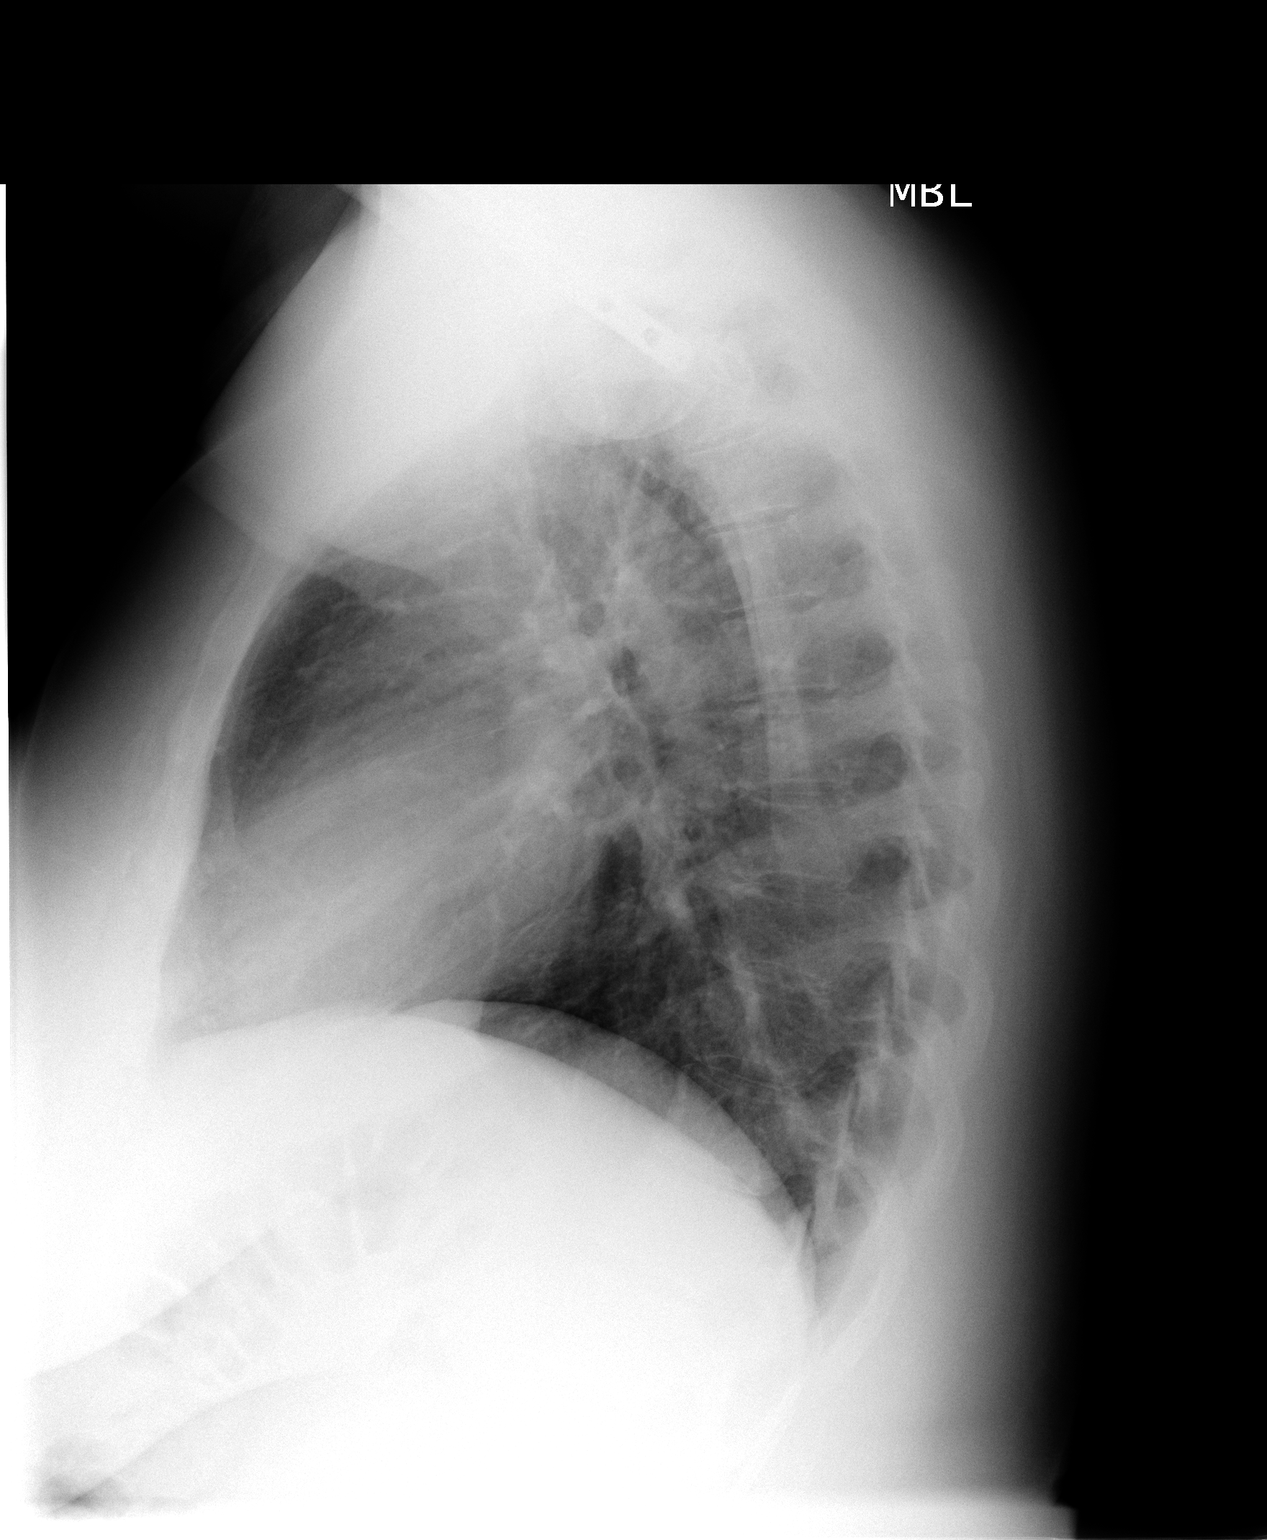

[2 of 2 positions shown; findings below may reference images not displayed]

FINDINGS: The heart size and mediastinal contours are within normal
limits.  Both lungs are clear.
IMPRESSION: No active disease.

## 2014-12-15 IMAGING — CR DG CHEST 2V
2 series · 2 of 2 positions shown · non-contrast
Comparison: December 06, 2012

CLINICAL DATA: Cough and chest pain

EXAM:
CHEST  2 VIEW

[view not recorded (1 of 2)]
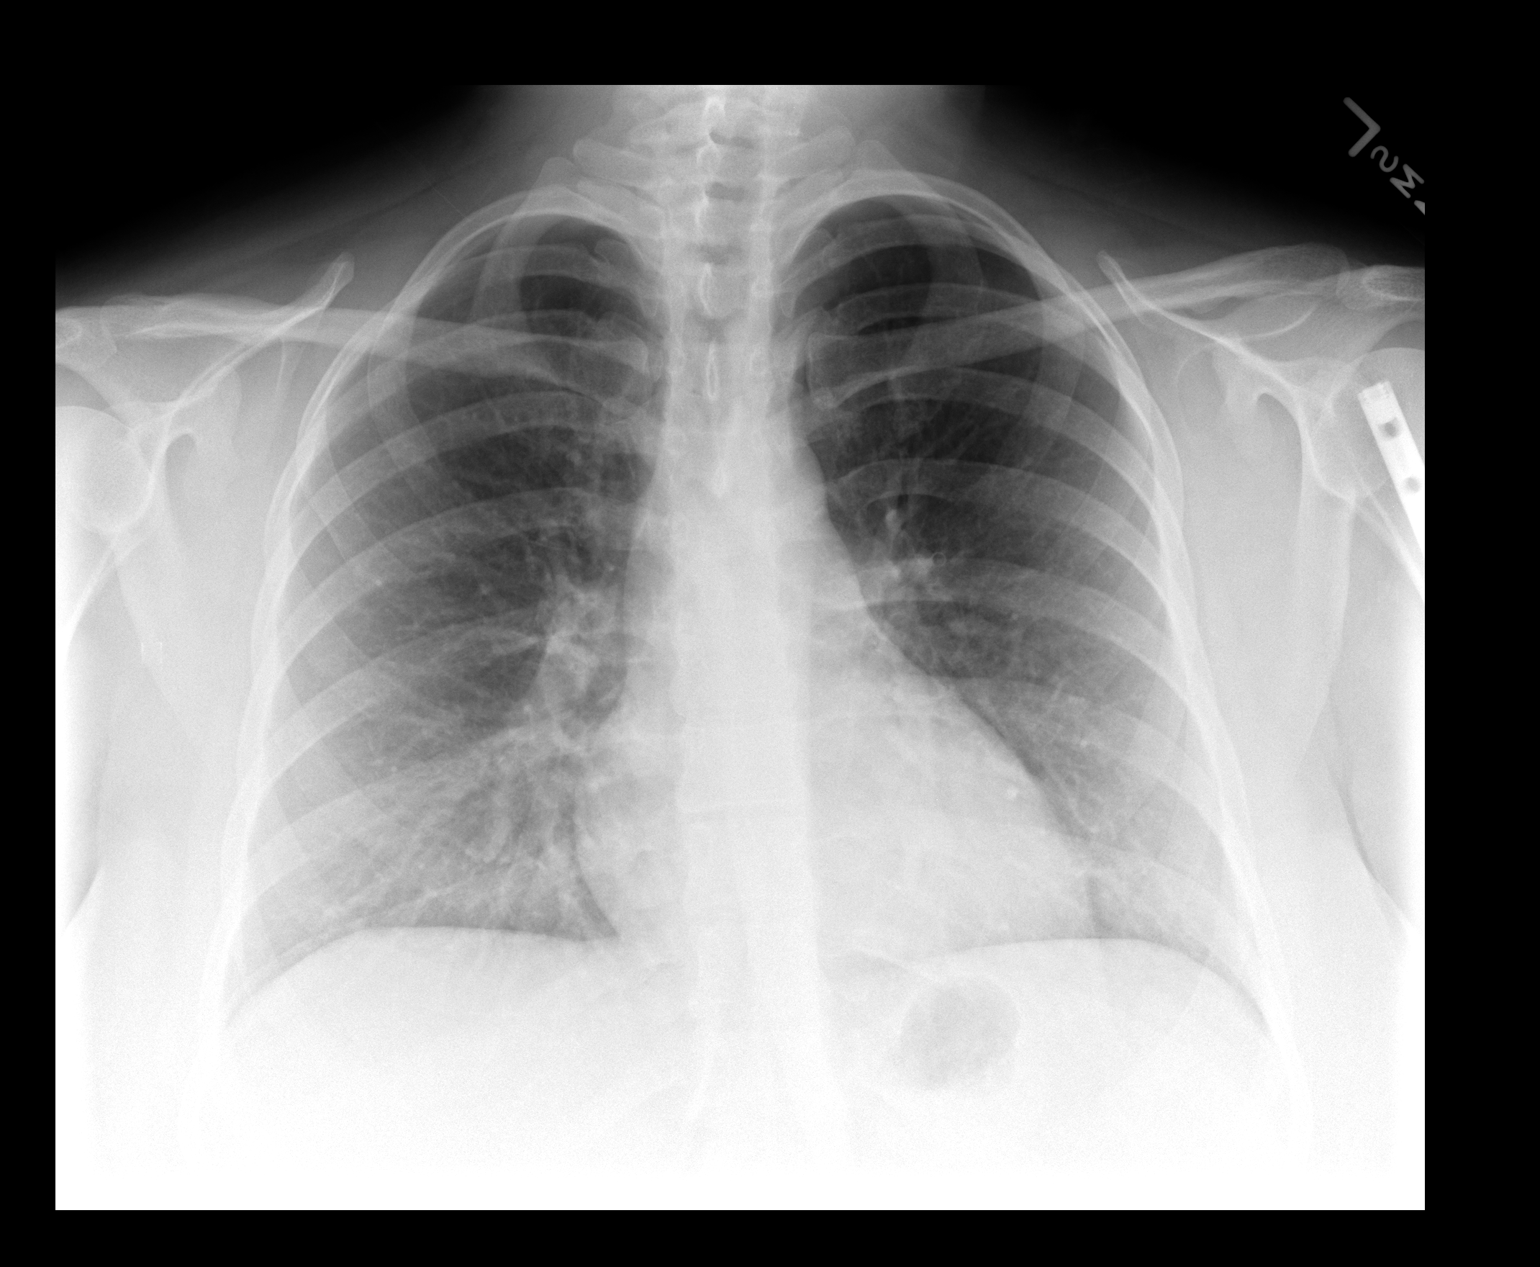

[view not recorded (2 of 2)]
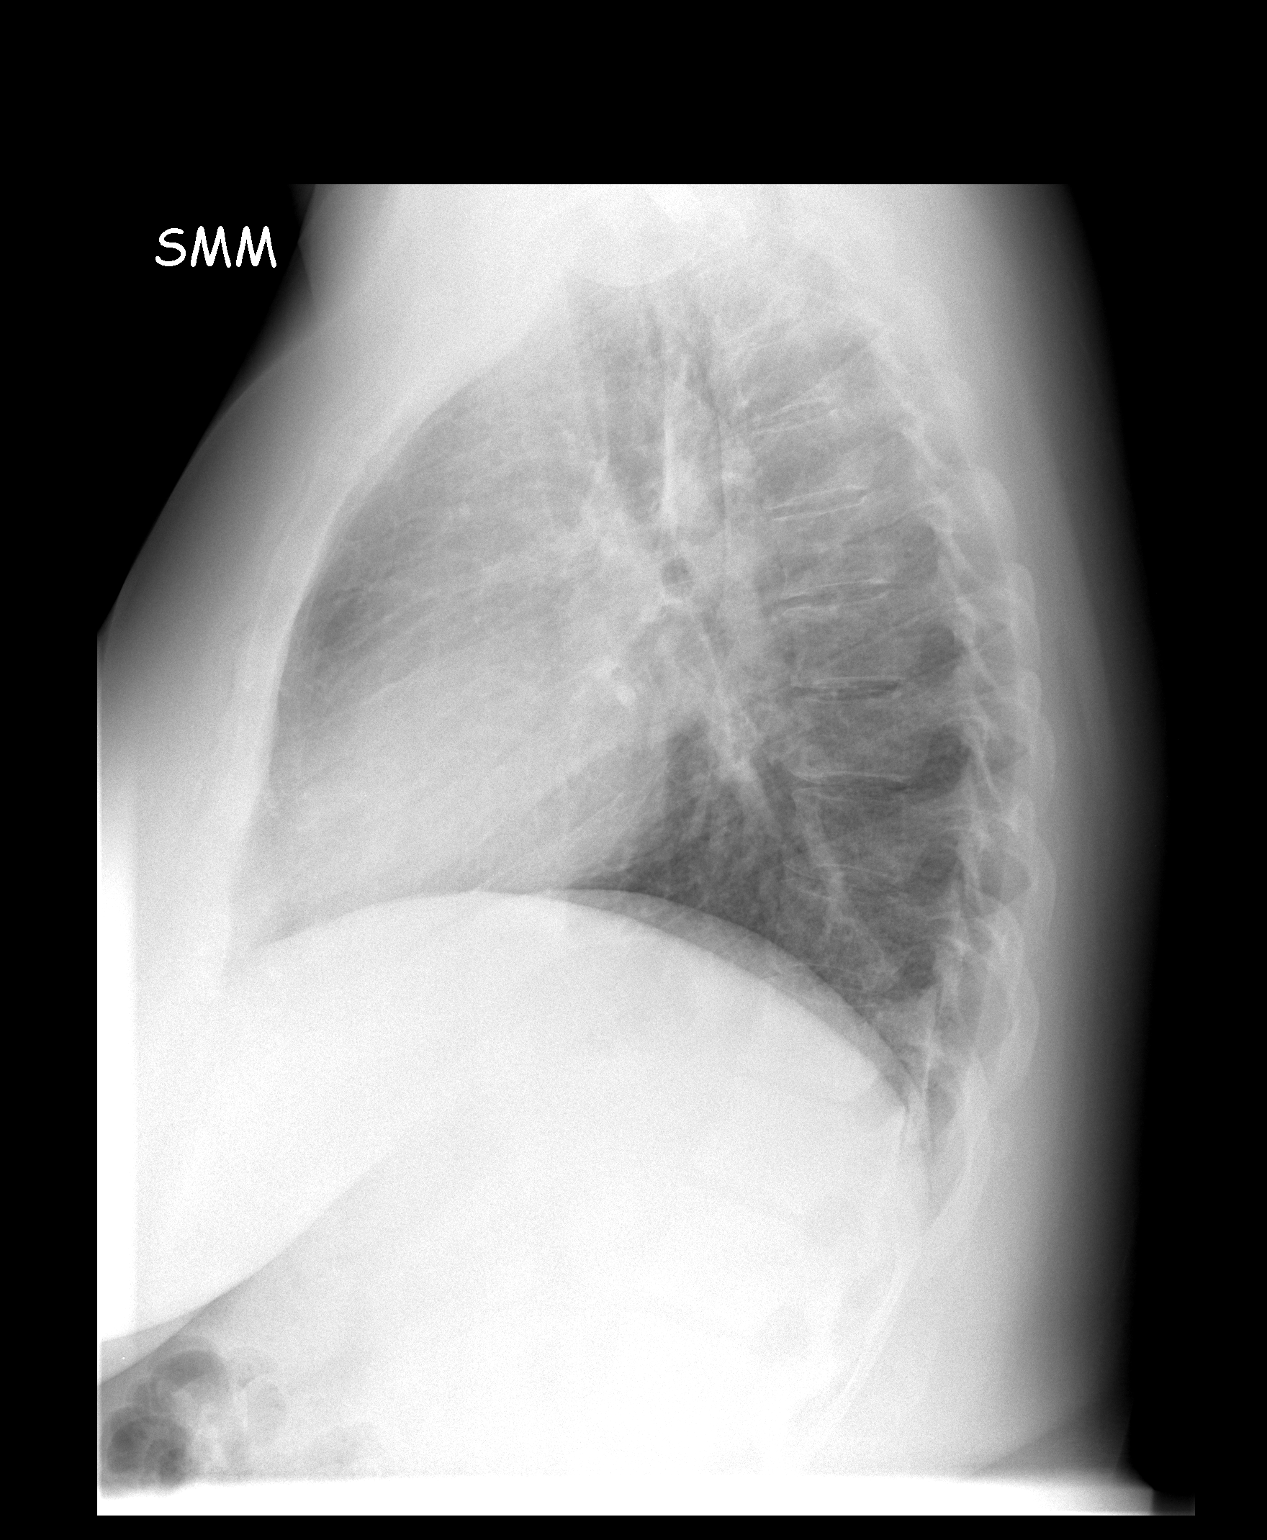

[2 of 2 positions shown; findings below may reference images not displayed]

FINDINGS: Lungs are clear. Heart size and pulmonary vascularity are normal. No
adenopathy. No pneumothorax. No bone lesions. There is postoperative
change in the proximal left humerus.
IMPRESSION: No edema or consolidation.

## 2015-06-23 ENCOUNTER — Telehealth: Payer: 59 | Admitting: Nurse Practitioner

## 2015-06-23 DIAGNOSIS — R05 Cough: Secondary | ICD-10-CM

## 2015-06-23 DIAGNOSIS — R059 Cough, unspecified: Secondary | ICD-10-CM

## 2015-06-23 MED ORDER — AZITHROMYCIN 250 MG PO TABS: ORAL_TABLET | ORAL | Status: AC

## 2015-06-23 MED ORDER — BENZONATATE 100 MG PO CAPS: 100.0000 mg | ORAL_CAPSULE | Freq: Three times a day (TID) | ORAL | Status: AC | PRN

## 2015-06-23 MED FILL — AZITHROMYCIN 250 MG TABLET: 250 | 5 days supply | Qty: 6 | Fill #0

## 2015-06-23 MED FILL — BENZONATATE 100 MG CAPSULE: 100 | 7 days supply | Qty: 20 | Fill #0

## 2015-06-23 NOTE — Progress Notes (Signed)

## 2015-06-25 DIAGNOSIS — J060 Acute laryngopharyngitis: Secondary | ICD-10-CM | POA: Diagnosis not present

## 2015-06-25 MED FILL — predniSONE 10 MG TABS: 10 | 10 days supply | Qty: 15 | Fill #0

## 2016-05-25 DIAGNOSIS — Z124 Encounter for screening for malignant neoplasm of cervix: Secondary | ICD-10-CM | POA: Diagnosis not present

## 2016-05-25 DIAGNOSIS — Z01419 Encounter for gynecological examination (general) (routine) without abnormal findings: Secondary | ICD-10-CM | POA: Diagnosis not present

## 2016-05-25 DIAGNOSIS — Z1151 Encounter for screening for human papillomavirus (HPV): Secondary | ICD-10-CM | POA: Diagnosis not present

## 2016-05-25 DIAGNOSIS — Z130 Encounter for screening for diseases of the blood and blood-forming organs and certain disorders involving the immune mechanism: Secondary | ICD-10-CM | POA: Diagnosis not present

## 2016-05-25 DIAGNOSIS — F330 Major depressive disorder, recurrent, mild: Secondary | ICD-10-CM | POA: Diagnosis not present

## 2016-05-25 DIAGNOSIS — Z1389 Encounter for screening for other disorder: Secondary | ICD-10-CM | POA: Diagnosis not present

## 2016-05-25 DIAGNOSIS — Z6835 Body mass index (BMI) 35.0-35.9, adult: Secondary | ICD-10-CM | POA: Diagnosis not present

## 2016-05-25 MED FILL — SERTRALINE HCL 50 MG TABLET: 50 | 90 days supply | Qty: 90 | Fill #0

## 2016-09-08 ENCOUNTER — Other Ambulatory Visit: Payer: Self-pay | Admitting: Family Medicine

## 2016-09-08 MED ORDER — AMOXICILLIN 875 MG PO TABS: 875.0000 mg | ORAL_TABLET | Freq: Two times a day (BID) | ORAL | 0 refills | Status: AC
